# Patient Record
Sex: Female | Born: 1940 | Race: White | Hispanic: No | Smoking: Former smoker
Health system: Southern US, Community
[De-identification: ages and names within clinical notes are randomized; demographics above are authoritative.]

## PROBLEM LIST (undated history)

## (undated) DIAGNOSIS — Z8052 Family history of malignant neoplasm of bladder: Secondary | ICD-10-CM

## (undated) DIAGNOSIS — Z800 Family history of malignant neoplasm of digestive organs: Secondary | ICD-10-CM

## (undated) DIAGNOSIS — I77819 Aortic ectasia, unspecified site: Secondary | ICD-10-CM

## (undated) DIAGNOSIS — I714 Abdominal aortic aneurysm, without rupture, unspecified: Secondary | ICD-10-CM

## (undated) DIAGNOSIS — J449 Chronic obstructive pulmonary disease, unspecified: Secondary | ICD-10-CM

## (undated) DIAGNOSIS — I6529 Occlusion and stenosis of unspecified carotid artery: Secondary | ICD-10-CM

## (undated) DIAGNOSIS — Z8051 Family history of malignant neoplasm of kidney: Secondary | ICD-10-CM

## (undated) DIAGNOSIS — M1990 Unspecified osteoarthritis, unspecified site: Secondary | ICD-10-CM

## (undated) DIAGNOSIS — C801 Malignant (primary) neoplasm, unspecified: Secondary | ICD-10-CM

## (undated) DIAGNOSIS — Z8481 Family history of carrier of genetic disease: Secondary | ICD-10-CM

## (undated) HISTORY — DX: Family history of malignant neoplasm of digestive organs: Z80.0

## (undated) HISTORY — DX: Family history of carrier of genetic disease: Z84.81

## (undated) HISTORY — DX: Family history of malignant neoplasm of kidney: Z80.51

## (undated) HISTORY — DX: Family history of malignant neoplasm of bladder: Z80.52

## (undated) HISTORY — PX: BACK SURGERY: SHX140

---
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
## 1972-05-28 HISTORY — PX: ABDOMINAL HYSTERECTOMY: SHX81

## 2002-05-28 DIAGNOSIS — R911 Solitary pulmonary nodule: Secondary | ICD-10-CM

## 2002-05-28 HISTORY — DX: Solitary pulmonary nodule: R91.1

## 2010-04-06 DIAGNOSIS — I10 Essential (primary) hypertension: Secondary | ICD-10-CM | POA: Insufficient documentation

## 2010-04-06 DIAGNOSIS — E785 Hyperlipidemia, unspecified: Secondary | ICD-10-CM | POA: Insufficient documentation

## 2010-11-27 DIAGNOSIS — R0600 Dyspnea, unspecified: Secondary | ICD-10-CM | POA: Insufficient documentation

## 2011-04-26 DIAGNOSIS — E559 Vitamin D deficiency, unspecified: Secondary | ICD-10-CM | POA: Insufficient documentation

## 2011-08-23 DIAGNOSIS — E039 Hypothyroidism, unspecified: Secondary | ICD-10-CM | POA: Insufficient documentation

## 2012-02-21 DIAGNOSIS — F0630 Mood disorder due to known physiological condition, unspecified: Secondary | ICD-10-CM | POA: Insufficient documentation

## 2012-05-28 HISTORY — PX: CAROTID ENDARTERECTOMY: SUR193

## 2012-05-28 HISTORY — PX: TOTAL KNEE ARTHROPLASTY: SHX125

## 2012-11-12 DIAGNOSIS — R55 Syncope and collapse: Secondary | ICD-10-CM | POA: Insufficient documentation

## 2012-11-12 DIAGNOSIS — G4700 Insomnia, unspecified: Secondary | ICD-10-CM | POA: Insufficient documentation

## 2013-05-28 HISTORY — PX: CAROTID ENDARTERECTOMY: SUR193

## 2014-05-28 HISTORY — PX: CHOLECYSTECTOMY: SHX55

## 2015-05-29 HISTORY — PX: CATARACT EXTRACTION: SUR2

## 2015-06-01 DIAGNOSIS — M5416 Radiculopathy, lumbar region: Secondary | ICD-10-CM | POA: Diagnosis not present

## 2015-06-01 DIAGNOSIS — M9903 Segmental and somatic dysfunction of lumbar region: Secondary | ICD-10-CM | POA: Diagnosis not present

## 2015-06-14 DIAGNOSIS — L03031 Cellulitis of right toe: Secondary | ICD-10-CM | POA: Diagnosis not present

## 2015-06-16 DIAGNOSIS — E039 Hypothyroidism, unspecified: Secondary | ICD-10-CM | POA: Diagnosis not present

## 2015-06-16 DIAGNOSIS — I119 Hypertensive heart disease without heart failure: Secondary | ICD-10-CM | POA: Diagnosis not present

## 2015-06-16 DIAGNOSIS — E782 Mixed hyperlipidemia: Secondary | ICD-10-CM | POA: Diagnosis not present

## 2015-06-20 DIAGNOSIS — Z1389 Encounter for screening for other disorder: Secondary | ICD-10-CM | POA: Diagnosis not present

## 2015-06-20 DIAGNOSIS — E782 Mixed hyperlipidemia: Secondary | ICD-10-CM | POA: Diagnosis not present

## 2015-06-20 DIAGNOSIS — Z6835 Body mass index (BMI) 35.0-35.9, adult: Secondary | ICD-10-CM | POA: Diagnosis not present

## 2015-06-20 DIAGNOSIS — E039 Hypothyroidism, unspecified: Secondary | ICD-10-CM | POA: Diagnosis not present

## 2015-06-29 DIAGNOSIS — M9903 Segmental and somatic dysfunction of lumbar region: Secondary | ICD-10-CM | POA: Diagnosis not present

## 2015-06-29 DIAGNOSIS — M5416 Radiculopathy, lumbar region: Secondary | ICD-10-CM | POA: Diagnosis not present

## 2015-07-27 DIAGNOSIS — M5416 Radiculopathy, lumbar region: Secondary | ICD-10-CM | POA: Diagnosis not present

## 2015-07-27 DIAGNOSIS — M9903 Segmental and somatic dysfunction of lumbar region: Secondary | ICD-10-CM | POA: Diagnosis not present

## 2015-07-28 DIAGNOSIS — M1711 Unilateral primary osteoarthritis, right knee: Secondary | ICD-10-CM | POA: Diagnosis not present

## 2015-08-22 DIAGNOSIS — G4700 Insomnia, unspecified: Secondary | ICD-10-CM | POA: Diagnosis not present

## 2015-08-22 DIAGNOSIS — J329 Chronic sinusitis, unspecified: Secondary | ICD-10-CM | POA: Diagnosis not present

## 2015-08-22 DIAGNOSIS — Z6837 Body mass index (BMI) 37.0-37.9, adult: Secondary | ICD-10-CM | POA: Diagnosis not present

## 2015-08-22 DIAGNOSIS — R05 Cough: Secondary | ICD-10-CM | POA: Diagnosis not present

## 2015-08-24 DIAGNOSIS — M5416 Radiculopathy, lumbar region: Secondary | ICD-10-CM | POA: Diagnosis not present

## 2015-08-24 DIAGNOSIS — M9903 Segmental and somatic dysfunction of lumbar region: Secondary | ICD-10-CM | POA: Diagnosis not present

## 2015-08-31 DIAGNOSIS — M9903 Segmental and somatic dysfunction of lumbar region: Secondary | ICD-10-CM | POA: Diagnosis not present

## 2015-08-31 DIAGNOSIS — M5416 Radiculopathy, lumbar region: Secondary | ICD-10-CM | POA: Diagnosis not present

## 2015-09-23 DIAGNOSIS — Z6837 Body mass index (BMI) 37.0-37.9, adult: Secondary | ICD-10-CM | POA: Diagnosis not present

## 2015-09-23 DIAGNOSIS — J40 Bronchitis, not specified as acute or chronic: Secondary | ICD-10-CM | POA: Diagnosis not present

## 2015-09-28 DIAGNOSIS — H40003 Preglaucoma, unspecified, bilateral: Secondary | ICD-10-CM | POA: Diagnosis not present

## 2015-09-28 DIAGNOSIS — H26493 Other secondary cataract, bilateral: Secondary | ICD-10-CM | POA: Diagnosis not present

## 2015-10-05 DIAGNOSIS — M9903 Segmental and somatic dysfunction of lumbar region: Secondary | ICD-10-CM | POA: Diagnosis not present

## 2015-10-05 DIAGNOSIS — M5416 Radiculopathy, lumbar region: Secondary | ICD-10-CM | POA: Diagnosis not present

## 2015-10-11 DIAGNOSIS — I119 Hypertensive heart disease without heart failure: Secondary | ICD-10-CM | POA: Diagnosis not present

## 2015-10-11 DIAGNOSIS — E782 Mixed hyperlipidemia: Secondary | ICD-10-CM | POA: Diagnosis not present

## 2015-10-18 DIAGNOSIS — Z6839 Body mass index (BMI) 39.0-39.9, adult: Secondary | ICD-10-CM | POA: Diagnosis not present

## 2015-10-18 DIAGNOSIS — E039 Hypothyroidism, unspecified: Secondary | ICD-10-CM | POA: Diagnosis not present

## 2015-10-18 DIAGNOSIS — Z7189 Other specified counseling: Secondary | ICD-10-CM | POA: Diagnosis not present

## 2015-10-18 DIAGNOSIS — E782 Mixed hyperlipidemia: Secondary | ICD-10-CM | POA: Diagnosis not present

## 2015-10-18 DIAGNOSIS — Z0000 Encounter for general adult medical examination without abnormal findings: Secondary | ICD-10-CM | POA: Diagnosis not present

## 2015-10-18 DIAGNOSIS — Z139 Encounter for screening, unspecified: Secondary | ICD-10-CM | POA: Diagnosis not present

## 2015-10-18 DIAGNOSIS — I119 Hypertensive heart disease without heart failure: Secondary | ICD-10-CM | POA: Diagnosis not present

## 2015-10-18 DIAGNOSIS — Z1389 Encounter for screening for other disorder: Secondary | ICD-10-CM | POA: Diagnosis not present

## 2015-10-25 DIAGNOSIS — Z1231 Encounter for screening mammogram for malignant neoplasm of breast: Secondary | ICD-10-CM | POA: Diagnosis not present

## 2015-11-02 DIAGNOSIS — M5416 Radiculopathy, lumbar region: Secondary | ICD-10-CM | POA: Diagnosis not present

## 2015-11-02 DIAGNOSIS — M9903 Segmental and somatic dysfunction of lumbar region: Secondary | ICD-10-CM | POA: Diagnosis not present

## 2015-11-08 DIAGNOSIS — M1711 Unilateral primary osteoarthritis, right knee: Secondary | ICD-10-CM | POA: Diagnosis not present

## 2015-11-11 DIAGNOSIS — M461 Sacroiliitis, not elsewhere classified: Secondary | ICD-10-CM | POA: Diagnosis not present

## 2015-11-23 DIAGNOSIS — M5416 Radiculopathy, lumbar region: Secondary | ICD-10-CM | POA: Diagnosis not present

## 2015-11-23 DIAGNOSIS — M9903 Segmental and somatic dysfunction of lumbar region: Secondary | ICD-10-CM | POA: Diagnosis not present

## 2015-12-01 DIAGNOSIS — M9903 Segmental and somatic dysfunction of lumbar region: Secondary | ICD-10-CM | POA: Diagnosis not present

## 2015-12-01 DIAGNOSIS — M5416 Radiculopathy, lumbar region: Secondary | ICD-10-CM | POA: Diagnosis not present

## 2015-12-21 DIAGNOSIS — I119 Hypertensive heart disease without heart failure: Secondary | ICD-10-CM | POA: Diagnosis not present

## 2015-12-21 DIAGNOSIS — E782 Mixed hyperlipidemia: Secondary | ICD-10-CM | POA: Diagnosis not present

## 2015-12-28 DIAGNOSIS — I6529 Occlusion and stenosis of unspecified carotid artery: Secondary | ICD-10-CM | POA: Diagnosis not present

## 2015-12-28 DIAGNOSIS — E039 Hypothyroidism, unspecified: Secondary | ICD-10-CM | POA: Diagnosis not present

## 2015-12-28 DIAGNOSIS — E782 Mixed hyperlipidemia: Secondary | ICD-10-CM | POA: Diagnosis not present

## 2015-12-28 DIAGNOSIS — I119 Hypertensive heart disease without heart failure: Secondary | ICD-10-CM | POA: Diagnosis not present

## 2015-12-29 DIAGNOSIS — M5416 Radiculopathy, lumbar region: Secondary | ICD-10-CM | POA: Diagnosis not present

## 2015-12-29 DIAGNOSIS — M9903 Segmental and somatic dysfunction of lumbar region: Secondary | ICD-10-CM | POA: Diagnosis not present

## 2016-01-11 DIAGNOSIS — Z6838 Body mass index (BMI) 38.0-38.9, adult: Secondary | ICD-10-CM | POA: Diagnosis not present

## 2016-01-11 DIAGNOSIS — Z01818 Encounter for other preprocedural examination: Secondary | ICD-10-CM | POA: Diagnosis not present

## 2016-01-11 DIAGNOSIS — I739 Peripheral vascular disease, unspecified: Secondary | ICD-10-CM | POA: Diagnosis not present

## 2016-01-16 DIAGNOSIS — R52 Pain, unspecified: Secondary | ICD-10-CM | POA: Diagnosis not present

## 2016-01-16 DIAGNOSIS — J984 Other disorders of lung: Secondary | ICD-10-CM | POA: Diagnosis not present

## 2016-01-16 DIAGNOSIS — M79609 Pain in unspecified limb: Secondary | ICD-10-CM | POA: Diagnosis not present

## 2016-01-16 DIAGNOSIS — Z01818 Encounter for other preprocedural examination: Secondary | ICD-10-CM | POA: Diagnosis not present

## 2016-01-16 DIAGNOSIS — Z79899 Other long term (current) drug therapy: Secondary | ICD-10-CM | POA: Diagnosis not present

## 2016-01-23 DIAGNOSIS — M1711 Unilateral primary osteoarthritis, right knee: Secondary | ICD-10-CM | POA: Diagnosis not present

## 2016-02-01 DIAGNOSIS — R079 Chest pain, unspecified: Secondary | ICD-10-CM | POA: Diagnosis not present

## 2016-02-01 DIAGNOSIS — I2510 Atherosclerotic heart disease of native coronary artery without angina pectoris: Secondary | ICD-10-CM | POA: Diagnosis not present

## 2016-02-01 DIAGNOSIS — I10 Essential (primary) hypertension: Secondary | ICD-10-CM | POA: Diagnosis not present

## 2016-02-01 DIAGNOSIS — Z7902 Long term (current) use of antithrombotics/antiplatelets: Secondary | ICD-10-CM | POA: Diagnosis not present

## 2016-02-01 DIAGNOSIS — Z471 Aftercare following joint replacement surgery: Secondary | ICD-10-CM | POA: Diagnosis not present

## 2016-02-01 DIAGNOSIS — Z880 Allergy status to penicillin: Secondary | ICD-10-CM | POA: Diagnosis not present

## 2016-02-01 DIAGNOSIS — E785 Hyperlipidemia, unspecified: Secondary | ICD-10-CM | POA: Diagnosis not present

## 2016-02-01 DIAGNOSIS — I739 Peripheral vascular disease, unspecified: Secondary | ICD-10-CM | POA: Diagnosis not present

## 2016-02-01 DIAGNOSIS — M1711 Unilateral primary osteoarthritis, right knee: Secondary | ICD-10-CM | POA: Diagnosis not present

## 2016-02-01 DIAGNOSIS — G8918 Other acute postprocedural pain: Secondary | ICD-10-CM | POA: Diagnosis not present

## 2016-02-01 DIAGNOSIS — E039 Hypothyroidism, unspecified: Secondary | ICD-10-CM | POA: Diagnosis not present

## 2016-02-01 DIAGNOSIS — Z885 Allergy status to narcotic agent status: Secondary | ICD-10-CM | POA: Diagnosis not present

## 2016-02-01 DIAGNOSIS — Z96651 Presence of right artificial knee joint: Secondary | ICD-10-CM | POA: Diagnosis not present

## 2016-02-01 DIAGNOSIS — Z79899 Other long term (current) drug therapy: Secondary | ICD-10-CM | POA: Diagnosis not present

## 2016-02-03 DIAGNOSIS — Z471 Aftercare following joint replacement surgery: Secondary | ICD-10-CM | POA: Diagnosis not present

## 2016-02-03 DIAGNOSIS — I739 Peripheral vascular disease, unspecified: Secondary | ICD-10-CM | POA: Diagnosis not present

## 2016-02-03 DIAGNOSIS — I119 Hypertensive heart disease without heart failure: Secondary | ICD-10-CM | POA: Diagnosis not present

## 2016-02-03 DIAGNOSIS — M1711 Unilateral primary osteoarthritis, right knee: Secondary | ICD-10-CM | POA: Diagnosis not present

## 2016-02-03 DIAGNOSIS — Z96651 Presence of right artificial knee joint: Secondary | ICD-10-CM | POA: Diagnosis not present

## 2016-02-03 DIAGNOSIS — G8918 Other acute postprocedural pain: Secondary | ICD-10-CM | POA: Diagnosis not present

## 2016-02-09 DIAGNOSIS — I739 Peripheral vascular disease, unspecified: Secondary | ICD-10-CM | POA: Diagnosis not present

## 2016-02-09 DIAGNOSIS — I119 Hypertensive heart disease without heart failure: Secondary | ICD-10-CM | POA: Diagnosis not present

## 2016-02-09 DIAGNOSIS — G8918 Other acute postprocedural pain: Secondary | ICD-10-CM | POA: Diagnosis not present

## 2016-02-09 DIAGNOSIS — Z471 Aftercare following joint replacement surgery: Secondary | ICD-10-CM | POA: Diagnosis not present

## 2016-02-17 DIAGNOSIS — M189 Osteoarthritis of first carpometacarpal joint, unspecified: Secondary | ICD-10-CM | POA: Diagnosis not present

## 2016-02-17 DIAGNOSIS — M5136 Other intervertebral disc degeneration, lumbar region: Secondary | ICD-10-CM | POA: Diagnosis not present

## 2016-02-17 DIAGNOSIS — Z79891 Long term (current) use of opiate analgesic: Secondary | ICD-10-CM | POA: Diagnosis not present

## 2016-02-17 DIAGNOSIS — I10 Essential (primary) hypertension: Secondary | ICD-10-CM | POA: Diagnosis not present

## 2016-02-17 DIAGNOSIS — Z7982 Long term (current) use of aspirin: Secondary | ICD-10-CM | POA: Diagnosis not present

## 2016-02-17 DIAGNOSIS — Z7902 Long term (current) use of antithrombotics/antiplatelets: Secondary | ICD-10-CM | POA: Diagnosis not present

## 2016-02-17 DIAGNOSIS — I739 Peripheral vascular disease, unspecified: Secondary | ICD-10-CM | POA: Diagnosis not present

## 2016-02-17 DIAGNOSIS — M4807 Spinal stenosis, lumbosacral region: Secondary | ICD-10-CM | POA: Diagnosis not present

## 2016-02-17 DIAGNOSIS — F341 Dysthymic disorder: Secondary | ICD-10-CM | POA: Diagnosis not present

## 2016-02-17 DIAGNOSIS — J45909 Unspecified asthma, uncomplicated: Secondary | ICD-10-CM | POA: Diagnosis not present

## 2016-02-17 DIAGNOSIS — Z471 Aftercare following joint replacement surgery: Secondary | ICD-10-CM | POA: Diagnosis not present

## 2016-02-23 DIAGNOSIS — Z7189 Other specified counseling: Secondary | ICD-10-CM | POA: Diagnosis not present

## 2016-02-23 DIAGNOSIS — E782 Mixed hyperlipidemia: Secondary | ICD-10-CM | POA: Diagnosis not present

## 2016-02-23 DIAGNOSIS — I739 Peripheral vascular disease, unspecified: Secondary | ICD-10-CM | POA: Diagnosis not present

## 2016-02-23 DIAGNOSIS — Z96651 Presence of right artificial knee joint: Secondary | ICD-10-CM | POA: Diagnosis not present

## 2016-03-13 DIAGNOSIS — Z96651 Presence of right artificial knee joint: Secondary | ICD-10-CM | POA: Diagnosis not present

## 2016-03-15 DIAGNOSIS — R600 Localized edema: Secondary | ICD-10-CM | POA: Diagnosis not present

## 2016-03-15 DIAGNOSIS — M7989 Other specified soft tissue disorders: Secondary | ICD-10-CM | POA: Diagnosis not present

## 2016-03-20 DIAGNOSIS — R609 Edema, unspecified: Secondary | ICD-10-CM | POA: Diagnosis not present

## 2016-03-20 DIAGNOSIS — Z6839 Body mass index (BMI) 39.0-39.9, adult: Secondary | ICD-10-CM | POA: Diagnosis not present

## 2016-03-23 DIAGNOSIS — Z23 Encounter for immunization: Secondary | ICD-10-CM | POA: Diagnosis not present

## 2016-03-23 DIAGNOSIS — Z6838 Body mass index (BMI) 38.0-38.9, adult: Secondary | ICD-10-CM | POA: Diagnosis not present

## 2016-03-23 DIAGNOSIS — R609 Edema, unspecified: Secondary | ICD-10-CM | POA: Diagnosis not present

## 2016-03-29 DIAGNOSIS — I119 Hypertensive heart disease without heart failure: Secondary | ICD-10-CM | POA: Diagnosis not present

## 2016-03-29 DIAGNOSIS — E039 Hypothyroidism, unspecified: Secondary | ICD-10-CM | POA: Diagnosis not present

## 2016-03-29 DIAGNOSIS — E782 Mixed hyperlipidemia: Secondary | ICD-10-CM | POA: Diagnosis not present

## 2016-04-05 DIAGNOSIS — E785 Hyperlipidemia, unspecified: Secondary | ICD-10-CM | POA: Diagnosis not present

## 2016-04-05 DIAGNOSIS — R600 Localized edema: Secondary | ICD-10-CM | POA: Diagnosis not present

## 2016-04-05 DIAGNOSIS — I119 Hypertensive heart disease without heart failure: Secondary | ICD-10-CM | POA: Diagnosis not present

## 2016-04-05 DIAGNOSIS — E039 Hypothyroidism, unspecified: Secondary | ICD-10-CM | POA: Diagnosis not present

## 2016-04-11 DIAGNOSIS — H26493 Other secondary cataract, bilateral: Secondary | ICD-10-CM | POA: Diagnosis not present

## 2016-04-11 DIAGNOSIS — H40003 Preglaucoma, unspecified, bilateral: Secondary | ICD-10-CM | POA: Diagnosis not present

## 2016-04-24 DIAGNOSIS — Z96651 Presence of right artificial knee joint: Secondary | ICD-10-CM | POA: Diagnosis not present

## 2016-05-11 DIAGNOSIS — R197 Diarrhea, unspecified: Secondary | ICD-10-CM | POA: Diagnosis not present

## 2016-05-11 DIAGNOSIS — Z800 Family history of malignant neoplasm of digestive organs: Secondary | ICD-10-CM | POA: Diagnosis not present

## 2016-05-11 DIAGNOSIS — Z86010 Personal history of colonic polyps: Secondary | ICD-10-CM | POA: Diagnosis not present

## 2016-05-11 DIAGNOSIS — Z1211 Encounter for screening for malignant neoplasm of colon: Secondary | ICD-10-CM | POA: Diagnosis not present

## 2016-05-17 DIAGNOSIS — R21 Rash and other nonspecific skin eruption: Secondary | ICD-10-CM | POA: Diagnosis not present

## 2016-05-17 DIAGNOSIS — Z6838 Body mass index (BMI) 38.0-38.9, adult: Secondary | ICD-10-CM | POA: Diagnosis not present

## 2016-06-04 DIAGNOSIS — M1712 Unilateral primary osteoarthritis, left knee: Secondary | ICD-10-CM | POA: Diagnosis not present

## 2016-06-19 DIAGNOSIS — E039 Hypothyroidism, unspecified: Secondary | ICD-10-CM | POA: Diagnosis not present

## 2016-06-19 DIAGNOSIS — Z7689 Persons encountering health services in other specified circumstances: Secondary | ICD-10-CM | POA: Diagnosis not present

## 2016-06-19 DIAGNOSIS — E669 Obesity, unspecified: Secondary | ICD-10-CM | POA: Diagnosis not present

## 2016-06-19 DIAGNOSIS — Z6837 Body mass index (BMI) 37.0-37.9, adult: Secondary | ICD-10-CM | POA: Diagnosis not present

## 2016-06-25 DIAGNOSIS — K5730 Diverticulosis of large intestine without perforation or abscess without bleeding: Secondary | ICD-10-CM | POA: Diagnosis not present

## 2016-06-25 DIAGNOSIS — I739 Peripheral vascular disease, unspecified: Secondary | ICD-10-CM | POA: Diagnosis not present

## 2016-06-25 DIAGNOSIS — E785 Hyperlipidemia, unspecified: Secondary | ICD-10-CM | POA: Diagnosis not present

## 2016-06-25 DIAGNOSIS — Z79899 Other long term (current) drug therapy: Secondary | ICD-10-CM | POA: Diagnosis not present

## 2016-06-25 DIAGNOSIS — Z86010 Personal history of colonic polyps: Secondary | ICD-10-CM | POA: Diagnosis not present

## 2016-06-25 DIAGNOSIS — K648 Other hemorrhoids: Secondary | ICD-10-CM | POA: Diagnosis not present

## 2016-06-25 DIAGNOSIS — Z9049 Acquired absence of other specified parts of digestive tract: Secondary | ICD-10-CM | POA: Diagnosis not present

## 2016-06-25 DIAGNOSIS — E039 Hypothyroidism, unspecified: Secondary | ICD-10-CM | POA: Diagnosis not present

## 2016-06-25 DIAGNOSIS — Z800 Family history of malignant neoplasm of digestive organs: Secondary | ICD-10-CM | POA: Diagnosis not present

## 2016-06-25 DIAGNOSIS — I10 Essential (primary) hypertension: Secondary | ICD-10-CM | POA: Diagnosis not present

## 2016-06-25 DIAGNOSIS — F419 Anxiety disorder, unspecified: Secondary | ICD-10-CM | POA: Diagnosis not present

## 2016-06-25 DIAGNOSIS — Z1211 Encounter for screening for malignant neoplasm of colon: Secondary | ICD-10-CM | POA: Diagnosis not present

## 2016-06-25 DIAGNOSIS — R197 Diarrhea, unspecified: Secondary | ICD-10-CM | POA: Diagnosis not present

## 2016-06-25 DIAGNOSIS — F329 Major depressive disorder, single episode, unspecified: Secondary | ICD-10-CM | POA: Diagnosis not present

## 2016-06-29 DIAGNOSIS — R1013 Epigastric pain: Secondary | ICD-10-CM | POA: Diagnosis not present

## 2016-06-29 DIAGNOSIS — Z6836 Body mass index (BMI) 36.0-36.9, adult: Secondary | ICD-10-CM | POA: Diagnosis not present

## 2016-06-29 DIAGNOSIS — Z8719 Personal history of other diseases of the digestive system: Secondary | ICD-10-CM | POA: Diagnosis not present

## 2016-06-29 DIAGNOSIS — I77819 Aortic ectasia, unspecified site: Secondary | ICD-10-CM | POA: Diagnosis not present

## 2016-07-04 DIAGNOSIS — R1013 Epigastric pain: Secondary | ICD-10-CM | POA: Diagnosis not present

## 2016-07-04 DIAGNOSIS — I77819 Aortic ectasia, unspecified site: Secondary | ICD-10-CM | POA: Diagnosis not present

## 2016-07-26 DIAGNOSIS — M1711 Unilateral primary osteoarthritis, right knee: Secondary | ICD-10-CM | POA: Diagnosis not present

## 2016-07-26 DIAGNOSIS — Z96651 Presence of right artificial knee joint: Secondary | ICD-10-CM | POA: Diagnosis not present

## 2016-09-03 DIAGNOSIS — L578 Other skin changes due to chronic exposure to nonionizing radiation: Secondary | ICD-10-CM | POA: Diagnosis not present

## 2016-09-03 DIAGNOSIS — L821 Other seborrheic keratosis: Secondary | ICD-10-CM | POA: Diagnosis not present

## 2016-09-03 DIAGNOSIS — L570 Actinic keratosis: Secondary | ICD-10-CM | POA: Diagnosis not present

## 2016-09-27 DIAGNOSIS — M2550 Pain in unspecified joint: Secondary | ICD-10-CM | POA: Diagnosis not present

## 2016-09-27 DIAGNOSIS — I77819 Aortic ectasia, unspecified site: Secondary | ICD-10-CM | POA: Diagnosis not present

## 2016-09-27 DIAGNOSIS — E039 Hypothyroidism, unspecified: Secondary | ICD-10-CM | POA: Diagnosis not present

## 2016-09-27 DIAGNOSIS — Z6836 Body mass index (BMI) 36.0-36.9, adult: Secondary | ICD-10-CM | POA: Diagnosis not present

## 2016-09-27 DIAGNOSIS — E669 Obesity, unspecified: Secondary | ICD-10-CM | POA: Diagnosis not present

## 2016-09-27 DIAGNOSIS — E785 Hyperlipidemia, unspecified: Secondary | ICD-10-CM | POA: Diagnosis not present

## 2016-11-27 DIAGNOSIS — M2550 Pain in unspecified joint: Secondary | ICD-10-CM | POA: Diagnosis not present

## 2016-11-27 DIAGNOSIS — L309 Dermatitis, unspecified: Secondary | ICD-10-CM | POA: Diagnosis not present

## 2016-11-27 DIAGNOSIS — Z139 Encounter for screening, unspecified: Secondary | ICD-10-CM | POA: Diagnosis not present

## 2016-11-27 DIAGNOSIS — Z6837 Body mass index (BMI) 37.0-37.9, adult: Secondary | ICD-10-CM | POA: Diagnosis not present

## 2016-11-27 DIAGNOSIS — Z1389 Encounter for screening for other disorder: Secondary | ICD-10-CM | POA: Diagnosis not present

## 2016-11-27 DIAGNOSIS — Z9181 History of falling: Secondary | ICD-10-CM | POA: Diagnosis not present

## 2017-01-10 DIAGNOSIS — N959 Unspecified menopausal and perimenopausal disorder: Secondary | ICD-10-CM | POA: Diagnosis not present

## 2017-01-10 DIAGNOSIS — Z1231 Encounter for screening mammogram for malignant neoplasm of breast: Secondary | ICD-10-CM | POA: Diagnosis not present

## 2017-01-10 DIAGNOSIS — Z136 Encounter for screening for cardiovascular disorders: Secondary | ICD-10-CM | POA: Diagnosis not present

## 2017-01-10 DIAGNOSIS — Z1389 Encounter for screening for other disorder: Secondary | ICD-10-CM | POA: Diagnosis not present

## 2017-01-10 DIAGNOSIS — Z0000 Encounter for general adult medical examination without abnormal findings: Secondary | ICD-10-CM | POA: Diagnosis not present

## 2017-01-10 DIAGNOSIS — Z87891 Personal history of nicotine dependence: Secondary | ICD-10-CM | POA: Diagnosis not present

## 2017-01-10 DIAGNOSIS — Z9181 History of falling: Secondary | ICD-10-CM | POA: Diagnosis not present

## 2017-01-10 DIAGNOSIS — E785 Hyperlipidemia, unspecified: Secondary | ICD-10-CM | POA: Diagnosis not present

## 2017-01-10 DIAGNOSIS — Z6837 Body mass index (BMI) 37.0-37.9, adult: Secondary | ICD-10-CM | POA: Diagnosis not present

## 2017-01-23 DIAGNOSIS — N959 Unspecified menopausal and perimenopausal disorder: Secondary | ICD-10-CM | POA: Diagnosis not present

## 2017-01-23 DIAGNOSIS — M8588 Other specified disorders of bone density and structure, other site: Secondary | ICD-10-CM | POA: Diagnosis not present

## 2017-01-23 DIAGNOSIS — Z1231 Encounter for screening mammogram for malignant neoplasm of breast: Secondary | ICD-10-CM | POA: Diagnosis not present

## 2017-01-29 DIAGNOSIS — Z96651 Presence of right artificial knee joint: Secondary | ICD-10-CM | POA: Diagnosis not present

## 2017-01-31 DIAGNOSIS — M1612 Unilateral primary osteoarthritis, left hip: Secondary | ICD-10-CM | POA: Diagnosis not present

## 2017-02-18 DIAGNOSIS — M1612 Unilateral primary osteoarthritis, left hip: Secondary | ICD-10-CM | POA: Diagnosis not present

## 2017-02-18 DIAGNOSIS — M25552 Pain in left hip: Secondary | ICD-10-CM | POA: Diagnosis not present

## 2017-02-22 DIAGNOSIS — M1612 Unilateral primary osteoarthritis, left hip: Secondary | ICD-10-CM | POA: Diagnosis not present

## 2017-04-04 DIAGNOSIS — E039 Hypothyroidism, unspecified: Secondary | ICD-10-CM | POA: Diagnosis not present

## 2017-04-04 DIAGNOSIS — E669 Obesity, unspecified: Secondary | ICD-10-CM | POA: Diagnosis not present

## 2017-04-04 DIAGNOSIS — E785 Hyperlipidemia, unspecified: Secondary | ICD-10-CM | POA: Diagnosis not present

## 2017-04-04 DIAGNOSIS — Z23 Encounter for immunization: Secondary | ICD-10-CM | POA: Diagnosis not present

## 2017-04-04 DIAGNOSIS — G3184 Mild cognitive impairment, so stated: Secondary | ICD-10-CM | POA: Diagnosis not present

## 2017-04-04 DIAGNOSIS — M2550 Pain in unspecified joint: Secondary | ICD-10-CM | POA: Diagnosis not present

## 2017-04-04 DIAGNOSIS — Z87891 Personal history of nicotine dependence: Secondary | ICD-10-CM | POA: Diagnosis not present

## 2017-05-16 DIAGNOSIS — R197 Diarrhea, unspecified: Secondary | ICD-10-CM | POA: Diagnosis not present

## 2017-05-16 DIAGNOSIS — Z23 Encounter for immunization: Secondary | ICD-10-CM | POA: Diagnosis not present

## 2017-05-16 DIAGNOSIS — E876 Hypokalemia: Secondary | ICD-10-CM | POA: Diagnosis not present

## 2017-05-16 DIAGNOSIS — A045 Campylobacter enteritis: Secondary | ICD-10-CM | POA: Diagnosis not present

## 2017-05-16 DIAGNOSIS — I77811 Abdominal aortic ectasia: Secondary | ICD-10-CM | POA: Diagnosis not present

## 2017-05-16 DIAGNOSIS — A419 Sepsis, unspecified organism: Secondary | ICD-10-CM | POA: Diagnosis not present

## 2017-05-16 DIAGNOSIS — Z87891 Personal history of nicotine dependence: Secondary | ICD-10-CM | POA: Diagnosis not present

## 2017-05-16 DIAGNOSIS — N390 Urinary tract infection, site not specified: Secondary | ICD-10-CM | POA: Diagnosis not present

## 2017-05-16 DIAGNOSIS — N201 Calculus of ureter: Secondary | ICD-10-CM | POA: Diagnosis not present

## 2017-05-16 DIAGNOSIS — Z6835 Body mass index (BMI) 35.0-35.9, adult: Secondary | ICD-10-CM | POA: Diagnosis not present

## 2017-05-16 DIAGNOSIS — I951 Orthostatic hypotension: Secondary | ICD-10-CM | POA: Diagnosis not present

## 2017-05-16 DIAGNOSIS — Z7982 Long term (current) use of aspirin: Secondary | ICD-10-CM | POA: Diagnosis not present

## 2017-05-16 DIAGNOSIS — E860 Dehydration: Secondary | ICD-10-CM | POA: Diagnosis not present

## 2017-05-16 DIAGNOSIS — N1330 Unspecified hydronephrosis: Secondary | ICD-10-CM | POA: Diagnosis not present

## 2017-05-16 DIAGNOSIS — Z79899 Other long term (current) drug therapy: Secondary | ICD-10-CM | POA: Diagnosis not present

## 2017-05-16 DIAGNOSIS — R109 Unspecified abdominal pain: Secondary | ICD-10-CM | POA: Diagnosis not present

## 2017-05-17 DIAGNOSIS — I951 Orthostatic hypotension: Secondary | ICD-10-CM | POA: Diagnosis not present

## 2017-05-17 DIAGNOSIS — A045 Campylobacter enteritis: Secondary | ICD-10-CM | POA: Diagnosis not present

## 2017-05-17 DIAGNOSIS — N1330 Unspecified hydronephrosis: Secondary | ICD-10-CM | POA: Diagnosis not present

## 2017-05-17 DIAGNOSIS — E860 Dehydration: Secondary | ICD-10-CM | POA: Diagnosis not present

## 2017-05-17 DIAGNOSIS — I77811 Abdominal aortic ectasia: Secondary | ICD-10-CM | POA: Diagnosis not present

## 2017-05-17 DIAGNOSIS — N201 Calculus of ureter: Secondary | ICD-10-CM | POA: Diagnosis not present

## 2017-05-23 DIAGNOSIS — N1330 Unspecified hydronephrosis: Secondary | ICD-10-CM | POA: Diagnosis not present

## 2017-05-23 DIAGNOSIS — N201 Calculus of ureter: Secondary | ICD-10-CM | POA: Diagnosis not present

## 2017-05-23 DIAGNOSIS — I77819 Aortic ectasia, unspecified site: Secondary | ICD-10-CM | POA: Diagnosis not present

## 2017-05-23 DIAGNOSIS — A419 Sepsis, unspecified organism: Secondary | ICD-10-CM | POA: Diagnosis not present

## 2017-05-23 DIAGNOSIS — Z79899 Other long term (current) drug therapy: Secondary | ICD-10-CM | POA: Diagnosis not present

## 2017-05-23 DIAGNOSIS — E860 Dehydration: Secondary | ICD-10-CM | POA: Diagnosis not present

## 2017-05-23 DIAGNOSIS — Z09 Encounter for follow-up examination after completed treatment for conditions other than malignant neoplasm: Secondary | ICD-10-CM | POA: Diagnosis not present

## 2017-05-23 DIAGNOSIS — A045 Campylobacter enteritis: Secondary | ICD-10-CM | POA: Diagnosis not present

## 2017-05-23 DIAGNOSIS — Z6838 Body mass index (BMI) 38.0-38.9, adult: Secondary | ICD-10-CM | POA: Diagnosis not present

## 2017-05-27 ENCOUNTER — Other Ambulatory Visit: Payer: Self-pay | Admitting: *Deleted

## 2017-05-27 NOTE — Patient Outreach (Signed)
[place] [facility]) Care Management  05/27/2017  [name] 06/20/1940 [id number]  Received referral from [place]; patient discharge from [facility] 05/18/2017;  Per [facility] patient was hospitalized from 12/20-12/22/2018 PCP=_Dr. Marco Collie  Telephone call attempt to patient; message recording does not identify patient. No message left. Telephone call to alternate number-person answered call & disconnected call when request to speak to patient was made.   Plan: Will follow up.  Sherrin Daisy, RN BSN [place] Management Coordinator [facility] Care Management  [phone]

## 2017-05-30 ENCOUNTER — Other Ambulatory Visit: Payer: Self-pay | Admitting: *Deleted

## 2017-05-30 DIAGNOSIS — N200 Calculus of kidney: Secondary | ICD-10-CM | POA: Diagnosis not present

## 2017-05-30 DIAGNOSIS — M549 Dorsalgia, unspecified: Secondary | ICD-10-CM | POA: Diagnosis not present

## 2017-05-30 NOTE — Patient Outreach (Signed)
[place] [facility]) Care Management  05/30/2017  Kaitlyn Martin [birth year] [id number]   Received referral from [place]; patient discharge from [facility] 05/18/2017;  Call #2 to alternate number-Patient identified self with name. States she is not available to talk now & requested call back tomorrow.   Plan: Follow up call. Patient agrees with set appointment.  Sherrin Daisy, RN BSN [place] Management Coordinator [facility] Care Management  [phone]

## 2017-05-31 ENCOUNTER — Ambulatory Visit: Payer: Self-pay | Admitting: *Deleted

## 2017-06-20 DIAGNOSIS — M1712 Unilateral primary osteoarthritis, left knee: Secondary | ICD-10-CM | POA: Diagnosis not present

## 2017-07-01 ENCOUNTER — Encounter: Payer: Self-pay | Admitting: *Deleted

## 2017-07-01 ENCOUNTER — Other Ambulatory Visit: Payer: Self-pay | Admitting: *Deleted

## 2017-07-01 NOTE — Patient Outreach (Signed)
[place] [facility]) Care Management  07/01/2017  STACI DACK 06-Apr-1941 [id number]  Unsuccessful telephone call attempts x 3.  Plan: Geophysicist/field seismologist. Will follow up.  Sherrin Daisy, RN BSN [place] Management Coordinator [facility] Care Management  [phone]

## 2017-07-16 ENCOUNTER — Other Ambulatory Visit: Payer: Self-pay | Admitting: *Deleted

## 2017-07-16 ENCOUNTER — Encounter: Payer: Self-pay | Admitting: *Deleted

## 2017-07-16 NOTE — Patient Outreach (Signed)
[place] [facility]) Care Management  07/16/2017  MUNIRA POLSON [birth year] [id number]  Unsuccessful call attempts; no response to outreach letter.   Plan: Send MD closure letter. Send to care management assistant for case closure.  Sherrin Daisy, RN BSN [place] Management Coordinator [facility] Care Management  [phone]

## 2017-10-03 DIAGNOSIS — M2550 Pain in unspecified joint: Secondary | ICD-10-CM | POA: Diagnosis not present

## 2017-10-03 DIAGNOSIS — E039 Hypothyroidism, unspecified: Secondary | ICD-10-CM | POA: Diagnosis not present

## 2017-10-03 DIAGNOSIS — Z6835 Body mass index (BMI) 35.0-35.9, adult: Secondary | ICD-10-CM | POA: Diagnosis not present

## 2017-10-03 DIAGNOSIS — E785 Hyperlipidemia, unspecified: Secondary | ICD-10-CM | POA: Diagnosis not present

## 2017-10-03 DIAGNOSIS — G4762 Sleep related leg cramps: Secondary | ICD-10-CM | POA: Diagnosis not present

## 2017-10-03 DIAGNOSIS — I77819 Aortic ectasia, unspecified site: Secondary | ICD-10-CM | POA: Diagnosis not present

## 2017-10-14 DIAGNOSIS — H40013 Open angle with borderline findings, low risk, bilateral: Secondary | ICD-10-CM | POA: Diagnosis not present

## 2017-10-14 DIAGNOSIS — H04123 Dry eye syndrome of bilateral lacrimal glands: Secondary | ICD-10-CM | POA: Diagnosis not present

## 2017-10-14 DIAGNOSIS — H26493 Other secondary cataract, bilateral: Secondary | ICD-10-CM | POA: Diagnosis not present

## 2017-10-14 DIAGNOSIS — H35371 Puckering of macula, right eye: Secondary | ICD-10-CM | POA: Diagnosis not present

## 2017-10-17 DIAGNOSIS — F17210 Nicotine dependence, cigarettes, uncomplicated: Secondary | ICD-10-CM | POA: Diagnosis not present

## 2017-10-17 DIAGNOSIS — G4762 Sleep related leg cramps: Secondary | ICD-10-CM | POA: Diagnosis not present

## 2017-10-22 DIAGNOSIS — M545 Low back pain: Secondary | ICD-10-CM | POA: Diagnosis not present

## 2017-10-22 DIAGNOSIS — M1612 Unilateral primary osteoarthritis, left hip: Secondary | ICD-10-CM | POA: Diagnosis not present

## 2017-10-28 DIAGNOSIS — M25552 Pain in left hip: Secondary | ICD-10-CM | POA: Diagnosis not present

## 2017-10-28 DIAGNOSIS — M1612 Unilateral primary osteoarthritis, left hip: Secondary | ICD-10-CM | POA: Diagnosis not present

## 2017-10-29 DIAGNOSIS — M5117 Intervertebral disc disorders with radiculopathy, lumbosacral region: Secondary | ICD-10-CM | POA: Diagnosis not present

## 2017-10-29 DIAGNOSIS — I714 Abdominal aortic aneurysm, without rupture: Secondary | ICD-10-CM | POA: Diagnosis not present

## 2017-10-29 DIAGNOSIS — M4807 Spinal stenosis, lumbosacral region: Secondary | ICD-10-CM | POA: Diagnosis not present

## 2017-10-29 DIAGNOSIS — Z981 Arthrodesis status: Secondary | ICD-10-CM | POA: Diagnosis not present

## 2017-10-29 DIAGNOSIS — M545 Low back pain: Secondary | ICD-10-CM | POA: Diagnosis not present

## 2017-10-31 DIAGNOSIS — M545 Low back pain: Secondary | ICD-10-CM | POA: Diagnosis not present

## 2017-11-06 DIAGNOSIS — G4762 Sleep related leg cramps: Secondary | ICD-10-CM | POA: Diagnosis not present

## 2017-11-06 DIAGNOSIS — Z6837 Body mass index (BMI) 37.0-37.9, adult: Secondary | ICD-10-CM | POA: Diagnosis not present

## 2017-11-06 DIAGNOSIS — I77819 Aortic ectasia, unspecified site: Secondary | ICD-10-CM | POA: Diagnosis not present

## 2017-11-06 DIAGNOSIS — E785 Hyperlipidemia, unspecified: Secondary | ICD-10-CM | POA: Diagnosis not present

## 2017-11-22 DIAGNOSIS — L821 Other seborrheic keratosis: Secondary | ICD-10-CM | POA: Diagnosis not present

## 2017-11-22 DIAGNOSIS — L578 Other skin changes due to chronic exposure to nonionizing radiation: Secondary | ICD-10-CM | POA: Diagnosis not present

## 2017-11-22 DIAGNOSIS — L570 Actinic keratosis: Secondary | ICD-10-CM | POA: Diagnosis not present

## 2017-12-24 DIAGNOSIS — M5416 Radiculopathy, lumbar region: Secondary | ICD-10-CM | POA: Diagnosis not present

## 2017-12-24 DIAGNOSIS — M1612 Unilateral primary osteoarthritis, left hip: Secondary | ICD-10-CM | POA: Diagnosis not present

## 2017-12-25 DIAGNOSIS — M545 Low back pain: Secondary | ICD-10-CM | POA: Diagnosis not present

## 2018-01-06 DIAGNOSIS — N200 Calculus of kidney: Secondary | ICD-10-CM | POA: Diagnosis not present

## 2018-01-07 DIAGNOSIS — N200 Calculus of kidney: Secondary | ICD-10-CM | POA: Diagnosis not present

## 2018-01-29 ENCOUNTER — Encounter: Payer: Self-pay | Admitting: Genetic Counselor

## 2018-01-29 ENCOUNTER — Ambulatory Visit (HOSPITAL_BASED_OUTPATIENT_CLINIC_OR_DEPARTMENT_OTHER): Payer: PPO | Admitting: Genetic Counselor

## 2018-01-29 ENCOUNTER — Inpatient Hospital Stay: Payer: PPO | Attending: Genetic Counselor

## 2018-01-29 DIAGNOSIS — Z8481 Family history of carrier of genetic disease: Secondary | ICD-10-CM

## 2018-01-29 DIAGNOSIS — Z800 Family history of malignant neoplasm of digestive organs: Secondary | ICD-10-CM | POA: Insufficient documentation

## 2018-01-29 DIAGNOSIS — Z8051 Family history of malignant neoplasm of kidney: Secondary | ICD-10-CM | POA: Diagnosis not present

## 2018-01-29 DIAGNOSIS — Z8052 Family history of malignant neoplasm of bladder: Secondary | ICD-10-CM | POA: Diagnosis not present

## 2018-01-29 NOTE — Progress Notes (Signed)
REFERRING PROVIDER: Isaias Sakai, [address] [place], [place] [address]  PRIMARY PROVIDER:  Philmore Pali, NP  PRIMARY REASON FOR VISIT:  1. Family history of bladder cancer   2. Family history of colon cancer   3. Family history of kidney cancer   4. Family history of genetic mutation for hereditary nonpolyposis colorectal cancer (HNPCC)      HISTORY OF PRESENT ILLNESS:   Ms. Butkiewicz, a 77 y.o. female, was seen for a [place] cancer genetics consultation due to a family history of cancer.  Ms. Hatcher presents to clinic today to discuss the possibility of a hereditary predisposition to cancer, genetic testing, and to further clarify her future cancer risks, as well as potential cancer risks for family members.   Ms. Hunkele is a 77 y.o. female with no personal history of cancer.  Her sister was diagnosed with bilateral urothelial cancer, and was referred for genetic testing.  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   CANCER HISTORY:   No history exists.     HORMONAL RISK FACTORS:  Menarche was at age 31.  First live birth at age 25.  OCP use for approximately 0 years.  Ovaries intact: unsure.  Hysterectomy: yes.  Menopausal status: postmenopausal.  HRT use: <1 years. Colonoscopy: yes; 10-12 polyps. Mammogram within the last year: yes. Number of breast biopsies: 0. Up to date with pelvic exams:  no. Any excessive radiation exposure in the past:  no  Past Medical History:  Diagnosis Date  . Family history of bladder cancer   . Family history of colon cancer   . Family history of genetic mutation for hereditary nonpolyposis colorectal cancer (HNPCC)   . Family history of kidney cancer      Social History   Socioeconomic History  . Marital status: Unknown    Spouse name: Not on file  . Number of children: Not on file  . Years of education: Not on file  . Highest education level: Not on file  Occupational  History  . Not on file  Social Needs  . Financial resource strain: Not on file  . Food insecurity:    Worry: Not on file    Inability: Not on file  . Transportation needs:    Medical: Not on file    Non-medical: Not on file  Tobacco Use  . Smoking status: Not on file  Substance and Sexual Activity  . Alcohol use: Not on file  . Drug use: Not on file  . Sexual activity: Not on file  Lifestyle  . Physical activity:    Days per week: Not on file    Minutes per session: Not on file  . Stress: Not on file  Relationships  . Social connections:    Talks on phone: Not on file    Gets together: Not on file    Attends religious service: Not on file    Active member of club or organization: Not on file    Attends meetings of clubs or organizations: Not on file    Relationship status: Not on file  Other Topics Concern  . Not on file  Social History Narrative  . Not on file     FAMILY HISTORY:  We obtained a detailed, 4-generation family history.  Significant diagnoses are listed below: Family History  Problem Relation Age of Onset  . Colon cancer Mother 44       d. 65  .  Tuberculosis Father   . Bladder Cancer Sister 108       bilateral urothelial  . Bladder Cancer Brother 51       urothelial  . Diabetes Maternal Aunt   . Bladder Cancer Brother        urothelial  . Kidney cancer Brother   . Bladder Cancer Other        urothelial, d. 26 - niece  . Schizophrenia Other   . Colon cancer Other        dx twice, under 5 and over 64; nephew  . Testicular cancer Other 24       grand-nephew    The patient has a son and two daughters who are cancer free.  She has two sisters and four brothers.  Two brothers have had urothelial cancer, one of which also had kidney cancer.  Another brother has a grandson with testicular cancer.  A sister was diagnosed with bilateral urothelial cancer and had a daughter with urothelial cancer.  This sister was found to have Lynch syndrome.  A second  sister has a son who had colon cancer twice, the first time under age 38.  The patient's parents are both deceased.  The patient's mother had colon cancer and died at 72.  She had three sisters and two brothers.  One sister had an unknown form of cancer.  There are two maternal cousins with cancer, one of which may have had breast cancer.  The maternal grandparents are deceased.  The patients father died of black lung and TB.  He had two sisters and a brother.  Both paternal grandparents are deceased.  The grandparents are reportedly related to each other.  Ms. Miltner is aware of previous family history of genetic testing for hereditary cancer risks. Patient's maternal ancestors are of [place] and [place] descent, and paternal ancestors are of [place] descent. There is no reported Ashkenazi Jewish ancestry. There is no known consanguinity.  GENETIC COUNSELING ASSESSMENT: JENNAYA POGUE is a 77 y.o. 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 We, therefore, discussed and recommended the following at today's visit.   DISCUSSION: We discussed that about 3-5% of colon cancer is due to Lynch syndrome.  There are five genes associated with Lynch syndrome, and the cancer risk is dependant on the gene in which there is a mutation. The patient's sister was diagnosed with bilateral urothelial cancer, and due to the family history, she was then tested and found to have Lynch syndrome.  Ms. Kuehl has a 50% chance of also having this same mutation. Her sister also has a variant of unknown significance in a gene called MSH3. This gene is a recessive cause of polyposis.  The MSH3 VUS should not be used to change medical management.  We reviewed the characteristics, features and inheritance patterns of hereditary cancer syndromes. We also discussed genetic testing, including the appropriate family members to test, the process  of testing, insurance coverage and turn-around-time for results. We discussed the implications of a negative, positive and/or variant of uncertain significant result. We recommended Ms. Barrientez pursue genetic testing for the MSH6 gene mutation and MSH3 VUS in the family.   The testing laboratory will cover genetic testing on any family member who is tested within 38 days of the report date.  Ms. Heng's test falls within this time frame.  PLAN: After considering the risks, benefits, and limitations, Ms. Drue Flirt  provided informed consent to pursue genetic testing and the blood sample was sent to [facility] for analysis of the MSH6 and MSH3 gene changes. Results should be available within approximately 2-3 weeks' time, at which point they will be disclosed by telephone to Ms. Shanker, as will any additional recommendations warranted by these results. Ms. Strauser will receive a summary of her genetic counseling visit and a copy of her results once available. This information will also be available in Epic. We encouraged Ms. Lennox to remain in contact with cancer genetics annually so that we can continuously update the family history and inform her of any changes in cancer genetics and testing that may be of benefit for her family. Ms. Banbury questions were answered to her satisfaction today. Our contact information was provided should additional questions or concerns arise.  Lastly, we encouraged Ms. Schwalbe to remain in contact with cancer genetics annually so that we can continuously update the family history and inform her of any changes in cancer genetics and testing that may be of benefit for this family.   Ms.  Stotz questions were answered to her satisfaction today. Our contact information was provided should additional questions or concerns arise. Thank you for the referral and allowing [place] to share in the care of your patient.   Karen P. Florene Glen, [place], [facility] Certified Genetic  Counselor [email or web address] phone: [phone]  The patient was seen for a total of 60 minutes in face-to-face genetic counseling.  This patient was discussed with Drs. Magrinat, Lindi Adie and/or Burr Medico who agrees with the above.    _______________________________________________________________________ For Office Staff:  Number of people involved in session: 3 Was an Intern/ student involved with case: no

## 2018-02-05 DIAGNOSIS — M545 Low back pain: Secondary | ICD-10-CM | POA: Diagnosis not present

## 2018-02-05 DIAGNOSIS — M5416 Radiculopathy, lumbar region: Secondary | ICD-10-CM | POA: Diagnosis not present

## 2018-02-05 DIAGNOSIS — M533 Sacrococcygeal disorders, not elsewhere classified: Secondary | ICD-10-CM | POA: Diagnosis not present

## 2018-02-11 ENCOUNTER — Encounter: Payer: Self-pay | Admitting: Genetic Counselor

## 2018-02-11 DIAGNOSIS — Z1379 Encounter for other screening for genetic and chromosomal anomalies: Secondary | ICD-10-CM | POA: Insufficient documentation

## 2018-02-13 DIAGNOSIS — M4727 Other spondylosis with radiculopathy, lumbosacral region: Secondary | ICD-10-CM | POA: Diagnosis not present

## 2018-02-13 DIAGNOSIS — M5416 Radiculopathy, lumbar region: Secondary | ICD-10-CM | POA: Diagnosis not present

## 2018-02-13 DIAGNOSIS — M4716 Other spondylosis with myelopathy, lumbar region: Secondary | ICD-10-CM | POA: Diagnosis not present

## 2018-03-14 DIAGNOSIS — Z1231 Encounter for screening mammogram for malignant neoplasm of breast: Secondary | ICD-10-CM | POA: Diagnosis not present

## 2018-04-02 DIAGNOSIS — M5416 Radiculopathy, lumbar region: Secondary | ICD-10-CM | POA: Diagnosis not present

## 2018-04-03 DIAGNOSIS — E785 Hyperlipidemia, unspecified: Secondary | ICD-10-CM | POA: Diagnosis not present

## 2018-04-03 DIAGNOSIS — I77819 Aortic ectasia, unspecified site: Secondary | ICD-10-CM | POA: Diagnosis not present

## 2018-04-03 DIAGNOSIS — R7303 Prediabetes: Secondary | ICD-10-CM | POA: Diagnosis not present

## 2018-04-03 DIAGNOSIS — M2550 Pain in unspecified joint: Secondary | ICD-10-CM | POA: Diagnosis not present

## 2018-04-03 DIAGNOSIS — Z6837 Body mass index (BMI) 37.0-37.9, adult: Secondary | ICD-10-CM | POA: Diagnosis not present

## 2018-04-08 DIAGNOSIS — M5416 Radiculopathy, lumbar region: Secondary | ICD-10-CM | POA: Diagnosis not present

## 2018-04-08 DIAGNOSIS — M4727 Other spondylosis with radiculopathy, lumbosacral region: Secondary | ICD-10-CM | POA: Diagnosis not present

## 2018-05-09 DIAGNOSIS — L728 Other follicular cysts of the skin and subcutaneous tissue: Secondary | ICD-10-CM | POA: Diagnosis not present

## 2018-05-09 DIAGNOSIS — L570 Actinic keratosis: Secondary | ICD-10-CM | POA: Diagnosis not present

## 2018-05-09 DIAGNOSIS — L820 Inflamed seborrheic keratosis: Secondary | ICD-10-CM | POA: Diagnosis not present

## 2018-05-13 DIAGNOSIS — E785 Hyperlipidemia, unspecified: Secondary | ICD-10-CM | POA: Diagnosis not present

## 2018-05-13 DIAGNOSIS — Z79899 Other long term (current) drug therapy: Secondary | ICD-10-CM | POA: Diagnosis not present

## 2018-05-13 DIAGNOSIS — E039 Hypothyroidism, unspecified: Secondary | ICD-10-CM | POA: Diagnosis not present

## 2018-05-26 DIAGNOSIS — I6529 Occlusion and stenosis of unspecified carotid artery: Secondary | ICD-10-CM | POA: Diagnosis not present

## 2018-05-26 DIAGNOSIS — E785 Hyperlipidemia, unspecified: Secondary | ICD-10-CM | POA: Diagnosis not present

## 2018-05-26 DIAGNOSIS — E039 Hypothyroidism, unspecified: Secondary | ICD-10-CM | POA: Diagnosis not present

## 2018-05-26 DIAGNOSIS — I77819 Aortic ectasia, unspecified site: Secondary | ICD-10-CM | POA: Diagnosis not present

## 2018-05-26 DIAGNOSIS — Z6837 Body mass index (BMI) 37.0-37.9, adult: Secondary | ICD-10-CM | POA: Diagnosis not present

## 2018-06-03 DIAGNOSIS — M545 Low back pain: Secondary | ICD-10-CM | POA: Diagnosis not present

## 2018-06-12 DIAGNOSIS — J209 Acute bronchitis, unspecified: Secondary | ICD-10-CM | POA: Diagnosis not present

## 2018-06-18 ENCOUNTER — Other Ambulatory Visit: Payer: Self-pay

## 2018-06-18 DIAGNOSIS — I6523 Occlusion and stenosis of bilateral carotid arteries: Secondary | ICD-10-CM

## 2018-07-07 DIAGNOSIS — E039 Hypothyroidism, unspecified: Secondary | ICD-10-CM | POA: Diagnosis not present

## 2018-07-10 ENCOUNTER — Ambulatory Visit (INDEPENDENT_AMBULATORY_CARE_PROVIDER_SITE_OTHER): Payer: PPO | Admitting: Vascular Surgery

## 2018-07-10 ENCOUNTER — Ambulatory Visit (HOSPITAL_COMMUNITY)
Admission: RE | Admit: 2018-07-10 | Discharge: 2018-07-10 | Disposition: A | Discharge status: Home / Self Care | Payer: PPO | Source: Ambulatory Visit | Attending: Family | Admitting: Family

## 2018-07-10 ENCOUNTER — Encounter: Payer: Self-pay | Admitting: Vascular Surgery

## 2018-07-10 ENCOUNTER — Other Ambulatory Visit: Payer: Self-pay

## 2018-07-10 VITALS — BP 115/70 | HR 106 | Temp 97.4°F | Resp 18 | Ht 62.5 in | Wt 206.0 lb

## 2018-07-10 DIAGNOSIS — I6523 Occlusion and stenosis of bilateral carotid arteries: Secondary | ICD-10-CM | POA: Diagnosis not present

## 2018-07-10 DIAGNOSIS — I77811 Abdominal aortic ectasia: Secondary | ICD-10-CM | POA: Diagnosis not present

## 2018-07-10 NOTE — Progress Notes (Signed)
Referring Physician: Charlott Holler PA  Patient name: Kaitlyn Martin MRN: [id number] DOB: December 08, 1940 Sex: female  REASON FOR CONSULT: Carotid stenosis, aortic ectasia  HPI: Kaitlyn Martin is a 78 y.o. female, who is status post left carotid endarterectomy in January 2015 and right carotid endarterectomy January 2014 at [facility].  These were both for asymptomatic carotid stenoses.  Patient denies any episodes of TIA amaurosis or stroke.  She states she has had some trouble with vision in her left eye recently.  But this sounds more like blurriness than amaurosis.  She states her eye doctor looked at her left eye recently and noticed no problems.  She also has a history of aortic ectasia.  She had a CT scan in 2018 which showed her aortic diameter was 2.9 cm.  She does have a brother with a known history of abdominal aortic aneurysm that was repaired in the past.  She is not currently on aspirin.  She states she did not take any aspirin because she is on Celebrex.  She does state that she thinks she may have had some blood in her stool recently but did not have this confirmed.  She is not currently on any H2 or proton pump inhibitor.  She is on simvastatin.  Other medical problems include multi-joint arthritis.  Past Medical History:  Diagnosis Date  . Family history of bladder cancer   . Family history of colon cancer   . Family history of genetic mutation for hereditary nonpolyposis colorectal cancer (HNPCC)   . Family history of kidney cancer     Past surgical history: Cholecystectomy, bilateral carotid endarterectomy  Family History  Problem Relation Age of Onset  . Colon cancer Mother 29       d. 31  . Tuberculosis Father   . Bladder Cancer Sister 58       bilateral urothelial  . Bladder Cancer Brother 4       urothelial  . Diabetes Maternal Aunt   . Bladder Cancer Brother        urothelial  . Kidney cancer Brother   . Bladder Cancer Other        urothelial, d. 91 -  niece  . Schizophrenia Other   . Colon cancer Other        dx twice, under 42 and over 49; nephew  . Testicular cancer Other 24       grand-nephew    SOCIAL HISTORY: Social History   Socioeconomic History  . Marital status: Unknown    Spouse name: Not on file  . Number of children: Not on file  . Years of education: Not on file  . Highest education level: Not on file  Occupational History  . Not on file  Social Needs  . Financial resource strain: Not on file  . Food insecurity:    Worry: Not on file    Inability: Not on file  . Transportation needs:    Medical: Not on file    Non-medical: Not on file  Tobacco Use  . Smoking status: Former Smoker    Last attempt to quit: 05/28/1998    Years since quitting: 20.1  . Smokeless tobacco: Never Used  Substance and Sexual Activity  . Alcohol use: Never    Frequency: Never  . Drug use: Not on file  . Sexual activity: Not on file  Lifestyle  . Physical activity:    Days per week: Not on file    Minutes per  session: Not on file  . Stress: Not on file  Relationships  . Social connections:    Talks on phone: Not on file    Gets together: Not on file    Attends religious service: Not on file    Active member of club or organization: Not on file    Attends meetings of clubs or organizations: Not on file    Relationship status: Not on file  . Intimate partner violence:    Fear of current or ex partner: Not on file    Emotionally abused: Not on file    Physically abused: Not on file    Forced sexual activity: Not on file  Other Topics Concern  . Not on file  Social History Narrative  . Not on file    Allergies  Allergen Reactions  . Codeine     Nausea   . Penicillins     Difficulty swallowing, difficulty breathing, hives    Current Outpatient Medications  Medication Sig Dispense Refill  . celecoxib (CELEBREX) 200 MG capsule Take 200 mg by mouth 2 (two) times daily.    Marland Kitchen levothyroxine (SYNTHROID, LEVOTHROID) 25  MCG tablet Take 25 mcg by mouth daily before breakfast.    . Nutritional Supplements (QUINOA/KALE/HEMP) LIQD Take by mouth.    . simvastatin (ZOCOR) 40 MG tablet Take 40 mg by mouth daily.    Marland Kitchen triamcinolone cream (KENALOG) 0.5 % Apply 1 application topically 2 (two) times daily.     No current facility-administered medications for this visit.    Simvastatin ROS:   General:  No weight loss, Fever, chills  HEENT: No recent headaches, no nasal bleeding, no visual changes, no sore throat  Neurologic: No dizziness, blackouts, seizures. No recent symptoms of stroke or mini- stroke. No recent episodes of slurred speech, or temporary blindness.  Cardiac: No recent episodes of chest pain/pressure, no shortness of breath at rest.  No shortness of breath with exertion.  Denies history of atrial fibrillation or irregular heartbeat  Vascular: No history of rest pain in feet.  No history of claudication.  No history of non-healing ulcer, No history of DVT   Pulmonary: No home oxygen, no productive cough, no hemoptysis,  No asthma or wheezing  Musculoskeletal:  [X]  Arthritis, [ ]  Low back pain,  [X]  Joint pain  Hematologic:No history of hypercoagulable state.  No history of easy bleeding.  No history of anemia  Gastrointestinal: No hematochezia or melena,  No gastroesophageal reflux, no trouble swallowing  Urinary: [ ]  chronic Kidney disease, [ ]  on HD - [ ]  MWF or [ ]  TTHS, [ ]  Burning with urination, [ ]  Frequent urination, [ ]  Difficulty urinating;   Skin: No rashes  Psychological: No history of anxiety,  No history of depression   Physical Examination  Vitals:   07/10/18 1210 07/10/18 1215  BP: 126/72 115/70  Pulse: (!) 106   Resp: 18   Temp: (!) 97.4 F (36.3 C)   TempSrc: Oral   SpO2: 100%   Weight: 206 lb (93.4 kg)   Height: 5' 2.5" (1.588 m)     Body mass index is 37.08 kg/m.  General:  Alert and oriented, no acute distress HEENT: Normal Neck: No bruit or  JVD Pulmonary: Clear to auscultation bilaterally Cardiac: Regular Rate and Rhythm without murmur Abdomen: Soft, non-tender, non-distended, no mass Skin: No rash Extremity Pulses:  2+ radial, brachial pulses bilaterally Musculoskeletal: No deformity or edema  Neurologic: Upper and lower extremity motor 5/5 and symmetric,  facial asymmetry  DATA:  Had a carotid duplex exam today which I reviewed and interpreted.  Left side showed no significant carotid stenosis.  Right side was 40 to 60%.  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  I discussed with patient today that she should be on aspirin 81 mg once daily in addition to her statin for prevention of stroke and cardiac disease long-term.  She will start an 81 mg aspirin daily today.  She states she may have had some dark stools I told her to discuss this more thoroughly with her primary provider.  I will leave it at their discretion whether or not to start her on H2 blocker proton pump inhibitor since she will now be on Celebrex and aspirin combo.  However, believe the risk of bleeding overall certainly is much less than her risk of cardiovascular events.  We will obtain a bilateral carotid duplex scan in 1 year.  2.  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  We will obtain an ultrasound of her abdominal aorta when she returns for follow-up in 1 year.   PLAN: Patient will be scheduled for follow-up in 1 year with our nurse practitioner and bilateral carotid duplex scan and aortic ultrasound at that time.  She will also start aspirin 81 mg daily.   Ruta Hinds, MD [facility] [place] Office: [phone] Pager: [phone]

## 2018-07-23 ENCOUNTER — Encounter: Payer: Self-pay | Admitting: Surgery

## 2018-07-28 DIAGNOSIS — K2900 Acute gastritis without bleeding: Secondary | ICD-10-CM | POA: Diagnosis not present

## 2018-07-28 DIAGNOSIS — N390 Urinary tract infection, site not specified: Secondary | ICD-10-CM | POA: Diagnosis not present

## 2018-07-28 DIAGNOSIS — Z6835 Body mass index (BMI) 35.0-35.9, adult: Secondary | ICD-10-CM | POA: Diagnosis not present

## 2018-07-30 DIAGNOSIS — N390 Urinary tract infection, site not specified: Secondary | ICD-10-CM | POA: Diagnosis not present

## 2018-09-02 DIAGNOSIS — C541 Malignant neoplasm of endometrium: Secondary | ICD-10-CM | POA: Diagnosis not present

## 2018-09-02 DIAGNOSIS — M545 Low back pain: Secondary | ICD-10-CM | POA: Diagnosis not present

## 2018-10-06 DIAGNOSIS — S41111A Laceration without foreign body of right upper arm, initial encounter: Secondary | ICD-10-CM | POA: Diagnosis not present

## 2018-10-06 DIAGNOSIS — Z6835 Body mass index (BMI) 35.0-35.9, adult: Secondary | ICD-10-CM | POA: Diagnosis not present

## 2018-11-03 DIAGNOSIS — M5416 Radiculopathy, lumbar region: Secondary | ICD-10-CM | POA: Diagnosis not present

## 2018-11-03 DIAGNOSIS — G894 Chronic pain syndrome: Secondary | ICD-10-CM | POA: Diagnosis not present

## 2018-11-03 DIAGNOSIS — M961 Postlaminectomy syndrome, not elsewhere classified: Secondary | ICD-10-CM | POA: Diagnosis not present

## 2018-11-03 DIAGNOSIS — M47816 Spondylosis without myelopathy or radiculopathy, lumbar region: Secondary | ICD-10-CM | POA: Diagnosis not present

## 2018-11-03 DIAGNOSIS — M549 Dorsalgia, unspecified: Secondary | ICD-10-CM | POA: Diagnosis not present

## 2018-11-03 DIAGNOSIS — M545 Low back pain: Secondary | ICD-10-CM | POA: Diagnosis not present

## 2018-11-03 DIAGNOSIS — R202 Paresthesia of skin: Secondary | ICD-10-CM | POA: Diagnosis not present

## 2018-11-03 DIAGNOSIS — M5136 Other intervertebral disc degeneration, lumbar region: Secondary | ICD-10-CM | POA: Diagnosis not present

## 2018-11-17 DIAGNOSIS — H35371 Puckering of macula, right eye: Secondary | ICD-10-CM | POA: Diagnosis not present

## 2018-11-18 DIAGNOSIS — R600 Localized edema: Secondary | ICD-10-CM | POA: Diagnosis not present

## 2018-11-18 DIAGNOSIS — I8310 Varicose veins of unspecified lower extremity with inflammation: Secondary | ICD-10-CM | POA: Diagnosis not present

## 2018-12-01 DIAGNOSIS — R202 Paresthesia of skin: Secondary | ICD-10-CM | POA: Diagnosis not present

## 2018-12-01 DIAGNOSIS — M47816 Spondylosis without myelopathy or radiculopathy, lumbar region: Secondary | ICD-10-CM | POA: Diagnosis not present

## 2018-12-01 DIAGNOSIS — M5416 Radiculopathy, lumbar region: Secondary | ICD-10-CM | POA: Diagnosis not present

## 2018-12-01 DIAGNOSIS — M549 Dorsalgia, unspecified: Secondary | ICD-10-CM | POA: Diagnosis not present

## 2018-12-01 DIAGNOSIS — M5136 Other intervertebral disc degeneration, lumbar region: Secondary | ICD-10-CM | POA: Diagnosis not present

## 2018-12-01 DIAGNOSIS — M545 Low back pain: Secondary | ICD-10-CM | POA: Diagnosis not present

## 2018-12-01 DIAGNOSIS — Z1389 Encounter for screening for other disorder: Secondary | ICD-10-CM | POA: Diagnosis not present

## 2018-12-01 DIAGNOSIS — M961 Postlaminectomy syndrome, not elsewhere classified: Secondary | ICD-10-CM | POA: Diagnosis not present

## 2018-12-01 DIAGNOSIS — G894 Chronic pain syndrome: Secondary | ICD-10-CM | POA: Diagnosis not present

## 2018-12-25 ENCOUNTER — Other Ambulatory Visit: Payer: Self-pay

## 2018-12-29 DIAGNOSIS — G894 Chronic pain syndrome: Secondary | ICD-10-CM | POA: Diagnosis not present

## 2018-12-29 DIAGNOSIS — M5416 Radiculopathy, lumbar region: Secondary | ICD-10-CM | POA: Diagnosis not present

## 2018-12-29 DIAGNOSIS — M5136 Other intervertebral disc degeneration, lumbar region: Secondary | ICD-10-CM | POA: Diagnosis not present

## 2018-12-29 DIAGNOSIS — Z1389 Encounter for screening for other disorder: Secondary | ICD-10-CM | POA: Diagnosis not present

## 2018-12-29 DIAGNOSIS — M961 Postlaminectomy syndrome, not elsewhere classified: Secondary | ICD-10-CM | POA: Diagnosis not present

## 2018-12-29 DIAGNOSIS — M47816 Spondylosis without myelopathy or radiculopathy, lumbar region: Secondary | ICD-10-CM | POA: Diagnosis not present

## 2018-12-29 DIAGNOSIS — M545 Low back pain: Secondary | ICD-10-CM | POA: Diagnosis not present

## 2019-01-08 DIAGNOSIS — M79674 Pain in right toe(s): Secondary | ICD-10-CM | POA: Insufficient documentation

## 2019-01-26 DIAGNOSIS — M5416 Radiculopathy, lumbar region: Secondary | ICD-10-CM | POA: Diagnosis not present

## 2019-01-26 DIAGNOSIS — Z1389 Encounter for screening for other disorder: Secondary | ICD-10-CM | POA: Diagnosis not present

## 2019-01-26 DIAGNOSIS — M961 Postlaminectomy syndrome, not elsewhere classified: Secondary | ICD-10-CM | POA: Diagnosis not present

## 2019-01-26 DIAGNOSIS — M47816 Spondylosis without myelopathy or radiculopathy, lumbar region: Secondary | ICD-10-CM | POA: Diagnosis not present

## 2019-01-26 DIAGNOSIS — G894 Chronic pain syndrome: Secondary | ICD-10-CM | POA: Diagnosis not present

## 2019-01-26 DIAGNOSIS — M545 Low back pain: Secondary | ICD-10-CM | POA: Diagnosis not present

## 2019-01-26 DIAGNOSIS — M5136 Other intervertebral disc degeneration, lumbar region: Secondary | ICD-10-CM | POA: Diagnosis not present

## 2019-02-23 DIAGNOSIS — M5136 Other intervertebral disc degeneration, lumbar region: Secondary | ICD-10-CM | POA: Diagnosis not present

## 2019-02-23 DIAGNOSIS — M961 Postlaminectomy syndrome, not elsewhere classified: Secondary | ICD-10-CM | POA: Diagnosis not present

## 2019-02-23 DIAGNOSIS — Z1389 Encounter for screening for other disorder: Secondary | ICD-10-CM | POA: Diagnosis not present

## 2019-02-23 DIAGNOSIS — M47816 Spondylosis without myelopathy or radiculopathy, lumbar region: Secondary | ICD-10-CM | POA: Diagnosis not present

## 2019-02-23 DIAGNOSIS — M5416 Radiculopathy, lumbar region: Secondary | ICD-10-CM | POA: Diagnosis not present

## 2019-02-23 DIAGNOSIS — M545 Low back pain: Secondary | ICD-10-CM | POA: Diagnosis not present

## 2019-02-23 DIAGNOSIS — G894 Chronic pain syndrome: Secondary | ICD-10-CM | POA: Diagnosis not present

## 2019-03-23 DIAGNOSIS — Z1389 Encounter for screening for other disorder: Secondary | ICD-10-CM | POA: Diagnosis not present

## 2019-03-23 DIAGNOSIS — M5136 Other intervertebral disc degeneration, lumbar region: Secondary | ICD-10-CM | POA: Diagnosis not present

## 2019-03-23 DIAGNOSIS — M545 Low back pain: Secondary | ICD-10-CM | POA: Diagnosis not present

## 2019-03-23 DIAGNOSIS — M961 Postlaminectomy syndrome, not elsewhere classified: Secondary | ICD-10-CM | POA: Diagnosis not present

## 2019-03-23 DIAGNOSIS — G894 Chronic pain syndrome: Secondary | ICD-10-CM | POA: Diagnosis not present

## 2019-03-23 DIAGNOSIS — M47816 Spondylosis without myelopathy or radiculopathy, lumbar region: Secondary | ICD-10-CM | POA: Diagnosis not present

## 2019-03-23 DIAGNOSIS — M5416 Radiculopathy, lumbar region: Secondary | ICD-10-CM | POA: Diagnosis not present

## 2019-04-14 DIAGNOSIS — M79674 Pain in right toe(s): Secondary | ICD-10-CM | POA: Diagnosis not present

## 2019-04-14 DIAGNOSIS — L309 Dermatitis, unspecified: Secondary | ICD-10-CM | POA: Insufficient documentation

## 2019-04-14 DIAGNOSIS — L308 Other specified dermatitis: Secondary | ICD-10-CM | POA: Diagnosis not present

## 2019-04-20 DIAGNOSIS — M5136 Other intervertebral disc degeneration, lumbar region: Secondary | ICD-10-CM | POA: Diagnosis not present

## 2019-04-20 DIAGNOSIS — M5416 Radiculopathy, lumbar region: Secondary | ICD-10-CM | POA: Diagnosis not present

## 2019-04-20 DIAGNOSIS — M961 Postlaminectomy syndrome, not elsewhere classified: Secondary | ICD-10-CM | POA: Diagnosis not present

## 2019-04-20 DIAGNOSIS — Z1389 Encounter for screening for other disorder: Secondary | ICD-10-CM | POA: Diagnosis not present

## 2019-04-20 DIAGNOSIS — M47816 Spondylosis without myelopathy or radiculopathy, lumbar region: Secondary | ICD-10-CM | POA: Diagnosis not present

## 2019-04-20 DIAGNOSIS — G894 Chronic pain syndrome: Secondary | ICD-10-CM | POA: Diagnosis not present

## 2019-04-20 DIAGNOSIS — M545 Low back pain: Secondary | ICD-10-CM | POA: Diagnosis not present

## 2019-05-14 DIAGNOSIS — M79674 Pain in right toe(s): Secondary | ICD-10-CM | POA: Diagnosis not present

## 2019-05-14 DIAGNOSIS — L308 Other specified dermatitis: Secondary | ICD-10-CM | POA: Diagnosis not present

## 2019-05-25 DIAGNOSIS — M5136 Other intervertebral disc degeneration, lumbar region: Secondary | ICD-10-CM | POA: Diagnosis not present

## 2019-05-25 DIAGNOSIS — G894 Chronic pain syndrome: Secondary | ICD-10-CM | POA: Diagnosis not present

## 2019-05-25 DIAGNOSIS — M961 Postlaminectomy syndrome, not elsewhere classified: Secondary | ICD-10-CM | POA: Diagnosis not present

## 2019-05-25 DIAGNOSIS — M545 Low back pain: Secondary | ICD-10-CM | POA: Diagnosis not present

## 2019-05-25 DIAGNOSIS — Z1389 Encounter for screening for other disorder: Secondary | ICD-10-CM | POA: Diagnosis not present

## 2019-05-25 DIAGNOSIS — M47816 Spondylosis without myelopathy or radiculopathy, lumbar region: Secondary | ICD-10-CM | POA: Diagnosis not present

## 2019-05-25 DIAGNOSIS — M5416 Radiculopathy, lumbar region: Secondary | ICD-10-CM | POA: Diagnosis not present

## 2019-07-28 ENCOUNTER — Other Ambulatory Visit: Payer: Self-pay | Admitting: *Deleted

## 2019-07-28 DIAGNOSIS — I6523 Occlusion and stenosis of bilateral carotid arteries: Secondary | ICD-10-CM

## 2019-07-28 DIAGNOSIS — R69 Illness, unspecified: Secondary | ICD-10-CM | POA: Diagnosis not present

## 2019-07-28 DIAGNOSIS — I77811 Abdominal aortic ectasia: Secondary | ICD-10-CM

## 2019-07-29 DIAGNOSIS — I77819 Aortic ectasia, unspecified site: Secondary | ICD-10-CM | POA: Diagnosis not present

## 2019-07-29 DIAGNOSIS — E785 Hyperlipidemia, unspecified: Secondary | ICD-10-CM | POA: Diagnosis not present

## 2019-07-29 DIAGNOSIS — Z139 Encounter for screening, unspecified: Secondary | ICD-10-CM | POA: Diagnosis not present

## 2019-07-29 DIAGNOSIS — Z6831 Body mass index (BMI) 31.0-31.9, adult: Secondary | ICD-10-CM | POA: Diagnosis not present

## 2019-07-29 DIAGNOSIS — I6529 Occlusion and stenosis of unspecified carotid artery: Secondary | ICD-10-CM | POA: Diagnosis not present

## 2019-07-29 DIAGNOSIS — E039 Hypothyroidism, unspecified: Secondary | ICD-10-CM | POA: Diagnosis not present

## 2019-07-29 DIAGNOSIS — Z1331 Encounter for screening for depression: Secondary | ICD-10-CM | POA: Diagnosis not present

## 2019-07-30 ENCOUNTER — Ambulatory Visit (INDEPENDENT_AMBULATORY_CARE_PROVIDER_SITE_OTHER): Payer: Medicare HMO | Admitting: Physician Assistant

## 2019-07-30 ENCOUNTER — Other Ambulatory Visit: Payer: Self-pay

## 2019-07-30 ENCOUNTER — Ambulatory Visit (INDEPENDENT_AMBULATORY_CARE_PROVIDER_SITE_OTHER)
Admission: RE | Admit: 2019-07-30 | Discharge: 2019-07-30 | Disposition: A | Discharge status: Home / Self Care | Payer: Medicare HMO | Source: Ambulatory Visit | Attending: Surgery | Admitting: Surgery

## 2019-07-30 ENCOUNTER — Ambulatory Visit (HOSPITAL_COMMUNITY)
Admission: RE | Admit: 2019-07-30 | Discharge: 2019-07-30 | Disposition: A | Discharge status: Home / Self Care | Payer: Medicare HMO | Source: Ambulatory Visit | Attending: Surgery | Admitting: Surgery

## 2019-07-30 DIAGNOSIS — I6523 Occlusion and stenosis of bilateral carotid arteries: Secondary | ICD-10-CM

## 2019-07-30 DIAGNOSIS — I77811 Abdominal aortic ectasia: Secondary | ICD-10-CM | POA: Diagnosis not present

## 2019-07-30 DIAGNOSIS — I6529 Occlusion and stenosis of unspecified carotid artery: Secondary | ICD-10-CM | POA: Insufficient documentation

## 2019-07-30 NOTE — Progress Notes (Signed)
    Established Carotid Patient   History of Present Illness   Kaitlyn Martin is a 79 y.o. ([birth year]) female who presents to go over vascular studies related to carotid artery stenosis and abdominal aortic aneurysm surveillance.  Surgical history significant for left carotid endarterectomy in January 2015 and right carotid endarterectomy in January 2014 at [facility].  Both surgeries were performed for asymptomatic carotid stenosis.  In the past year she denies any diagnosis of CVA or TIA.  She also denies any strokelike symptoms including slurring speech, one-sided weakness, or drastic changes in vision.  She does have some progressive left eye blurry vision however this is followed by her eye doctor who she will see later this month.  She is taking an aspirin and statin daily.  She is also followed for aortic ectasia.  She denies new or changing abdominal or back pain.  She also denies any claudication, rest pain, or tissue discoloration of bilateral lower extremities.  She denies tobacco use.  The patient's PMH, PSH, SH, and FamHx were reviewed and are unchanged from prior visit.  Current Outpatient Medications  Medication Sig Dispense Refill  . celecoxib (CELEBREX) 200 MG capsule Take 200 mg by mouth 2 (two) times daily.    Marland Kitchen levothyroxine (SYNTHROID, LEVOTHROID) 25 MCG tablet Take 25 mcg by mouth daily before breakfast.    . Nutritional Supplements (QUINOA/KALE/HEMP) LIQD Take by mouth.    . simvastatin (ZOCOR) 40 MG tablet Take 40 mg by mouth daily.    Marland Kitchen triamcinolone cream (KENALOG) 0.5 % Apply 1 application topically 2 (two) times daily.     No current facility-administered medications for this visit.    On ROS today: 10 system ROS negative unless otherwise noted in HPI   Physical Examination   Vitals:   07/30/19 0953  BP: (!) 151/70  Pulse: 60  Temp: 97.8 F (36.6 C)  TempSrc: Oral  SpO2: 98%  Weight: 177 lb 1.6 oz (80.3 kg)  Height: 5' 2.5" (1.588 m)   Body  mass index is 31.88 kg/m.  General Alert, O x 3, WD, NAD  Neck Supple, mid-line trachea, Neck incision healed  Pulmonary Sym exp, good B air movt  Cardiac RRR, Nl S1, S2,  Gastro- intestinal soft, non-distended, non-tender to palpation,   Musculo- skeletal M/S 5/5 throughout  , Extremities without ischemic changes    Neurologic Cranial nerves 2-12 intact    Non-Invasive Vascular Imaging   B Carotid Duplex :   R ICA stenosis:  40-59%  R VA:  patent and antegrade  L ICA stenosis:  40-59%  L VA:   patent and antegrade  AAA duplex  3.4cm ectatic mid aorta   Medical Decision Making   Kaitlyn Martin is a 79 y.o. female who presents for surveillance of carotid stenosis and ectatic aorta   Carotid duplex demonstrates bilateral ICA 40 to 59% stenosis  Chronic, progressive blurry vision unlikely related; follow-up with optometrist as scheduled  Recheck carotid duplex in 1 year   Aorta is ectatic with the mid segment measuring now 3.4 cm  Recheck AAA duplex in 1 year  Continue aspirin and statin daily   Dagoberto Ligas PA-C [facility] [place] Office: [phone]  Clinic MD: Oneida Alar

## 2019-07-31 ENCOUNTER — Other Ambulatory Visit: Payer: Self-pay | Admitting: *Deleted

## 2019-07-31 DIAGNOSIS — I6523 Occlusion and stenosis of bilateral carotid arteries: Secondary | ICD-10-CM

## 2019-07-31 DIAGNOSIS — I77811 Abdominal aortic ectasia: Secondary | ICD-10-CM

## 2019-08-31 DIAGNOSIS — M8589 Other specified disorders of bone density and structure, multiple sites: Secondary | ICD-10-CM | POA: Diagnosis not present

## 2019-08-31 DIAGNOSIS — E2839 Other primary ovarian failure: Secondary | ICD-10-CM | POA: Diagnosis not present

## 2019-08-31 DIAGNOSIS — M85832 Other specified disorders of bone density and structure, left forearm: Secondary | ICD-10-CM | POA: Diagnosis not present

## 2019-08-31 DIAGNOSIS — Z1231 Encounter for screening mammogram for malignant neoplasm of breast: Secondary | ICD-10-CM | POA: Diagnosis not present

## 2019-10-07 DIAGNOSIS — N9489 Other specified conditions associated with female genital organs and menstrual cycle: Secondary | ICD-10-CM | POA: Diagnosis not present

## 2019-10-07 DIAGNOSIS — N952 Postmenopausal atrophic vaginitis: Secondary | ICD-10-CM | POA: Diagnosis not present

## 2019-10-07 DIAGNOSIS — R3989 Other symptoms and signs involving the genitourinary system: Secondary | ICD-10-CM | POA: Diagnosis not present

## 2019-10-07 DIAGNOSIS — R351 Nocturia: Secondary | ICD-10-CM | POA: Diagnosis not present

## 2019-10-07 DIAGNOSIS — N8111 Cystocele, midline: Secondary | ICD-10-CM | POA: Insufficient documentation

## 2019-10-07 DIAGNOSIS — R82998 Other abnormal findings in urine: Secondary | ICD-10-CM | POA: Diagnosis not present

## 2019-11-17 DIAGNOSIS — R69 Illness, unspecified: Secondary | ICD-10-CM | POA: Diagnosis not present

## 2019-11-20 DIAGNOSIS — H18523 Epithelial (juvenile) corneal dystrophy, bilateral: Secondary | ICD-10-CM | POA: Diagnosis not present

## 2019-11-26 DIAGNOSIS — L03031 Cellulitis of right toe: Secondary | ICD-10-CM | POA: Diagnosis not present

## 2020-01-01 DIAGNOSIS — M545 Low back pain: Secondary | ICD-10-CM | POA: Diagnosis not present

## 2020-01-01 DIAGNOSIS — M4316 Spondylolisthesis, lumbar region: Secondary | ICD-10-CM | POA: Diagnosis not present

## 2020-02-02 DIAGNOSIS — I77819 Aortic ectasia, unspecified site: Secondary | ICD-10-CM | POA: Diagnosis not present

## 2020-02-02 DIAGNOSIS — N819 Female genital prolapse, unspecified: Secondary | ICD-10-CM | POA: Diagnosis not present

## 2020-02-02 DIAGNOSIS — M8580 Other specified disorders of bone density and structure, unspecified site: Secondary | ICD-10-CM | POA: Diagnosis not present

## 2020-02-02 DIAGNOSIS — I6529 Occlusion and stenosis of unspecified carotid artery: Secondary | ICD-10-CM | POA: Diagnosis not present

## 2020-02-02 DIAGNOSIS — Z6831 Body mass index (BMI) 31.0-31.9, adult: Secondary | ICD-10-CM | POA: Diagnosis not present

## 2020-02-02 DIAGNOSIS — M2550 Pain in unspecified joint: Secondary | ICD-10-CM | POA: Diagnosis not present

## 2020-02-02 DIAGNOSIS — G3184 Mild cognitive impairment, so stated: Secondary | ICD-10-CM | POA: Diagnosis not present

## 2020-02-02 DIAGNOSIS — Z9181 History of falling: Secondary | ICD-10-CM | POA: Diagnosis not present

## 2020-02-02 DIAGNOSIS — E785 Hyperlipidemia, unspecified: Secondary | ICD-10-CM | POA: Diagnosis not present

## 2020-02-02 DIAGNOSIS — E039 Hypothyroidism, unspecified: Secondary | ICD-10-CM | POA: Diagnosis not present

## 2020-02-08 DIAGNOSIS — R69 Illness, unspecified: Secondary | ICD-10-CM | POA: Diagnosis not present

## 2020-02-09 DIAGNOSIS — M47816 Spondylosis without myelopathy or radiculopathy, lumbar region: Secondary | ICD-10-CM | POA: Diagnosis not present

## 2020-02-09 DIAGNOSIS — M961 Postlaminectomy syndrome, not elsewhere classified: Secondary | ICD-10-CM | POA: Diagnosis not present

## 2020-02-09 DIAGNOSIS — G894 Chronic pain syndrome: Secondary | ICD-10-CM | POA: Diagnosis not present

## 2020-02-09 DIAGNOSIS — M5136 Other intervertebral disc degeneration, lumbar region: Secondary | ICD-10-CM | POA: Diagnosis not present

## 2020-02-09 DIAGNOSIS — M5416 Radiculopathy, lumbar region: Secondary | ICD-10-CM | POA: Diagnosis not present

## 2020-02-09 DIAGNOSIS — Z1389 Encounter for screening for other disorder: Secondary | ICD-10-CM | POA: Diagnosis not present

## 2020-02-17 DIAGNOSIS — R69 Illness, unspecified: Secondary | ICD-10-CM | POA: Diagnosis not present

## 2020-02-19 DIAGNOSIS — I8310 Varicose veins of unspecified lower extremity with inflammation: Secondary | ICD-10-CM | POA: Diagnosis not present

## 2020-02-19 DIAGNOSIS — L570 Actinic keratosis: Secondary | ICD-10-CM | POA: Diagnosis not present

## 2020-02-19 DIAGNOSIS — L853 Xerosis cutis: Secondary | ICD-10-CM | POA: Diagnosis not present

## 2020-02-26 DIAGNOSIS — R69 Illness, unspecified: Secondary | ICD-10-CM | POA: Diagnosis not present

## 2020-06-02 DIAGNOSIS — M961 Postlaminectomy syndrome, not elsewhere classified: Secondary | ICD-10-CM | POA: Diagnosis not present

## 2020-06-03 DIAGNOSIS — M47816 Spondylosis without myelopathy or radiculopathy, lumbar region: Secondary | ICD-10-CM | POA: Diagnosis not present

## 2020-06-03 DIAGNOSIS — E669 Obesity, unspecified: Secondary | ICD-10-CM | POA: Diagnosis not present

## 2020-06-03 DIAGNOSIS — M961 Postlaminectomy syndrome, not elsewhere classified: Secondary | ICD-10-CM | POA: Diagnosis not present

## 2020-06-03 DIAGNOSIS — M5136 Other intervertebral disc degeneration, lumbar region: Secondary | ICD-10-CM | POA: Diagnosis not present

## 2020-06-03 DIAGNOSIS — M5416 Radiculopathy, lumbar region: Secondary | ICD-10-CM | POA: Diagnosis not present

## 2020-07-01 DIAGNOSIS — M47816 Spondylosis without myelopathy or radiculopathy, lumbar region: Secondary | ICD-10-CM | POA: Diagnosis not present

## 2020-07-01 DIAGNOSIS — M961 Postlaminectomy syndrome, not elsewhere classified: Secondary | ICD-10-CM | POA: Diagnosis not present

## 2020-07-01 DIAGNOSIS — E669 Obesity, unspecified: Secondary | ICD-10-CM | POA: Diagnosis not present

## 2020-07-01 DIAGNOSIS — M5416 Radiculopathy, lumbar region: Secondary | ICD-10-CM | POA: Diagnosis not present

## 2020-07-01 DIAGNOSIS — M5136 Other intervertebral disc degeneration, lumbar region: Secondary | ICD-10-CM | POA: Diagnosis not present

## 2020-07-22 DIAGNOSIS — M19041 Primary osteoarthritis, right hand: Secondary | ICD-10-CM | POA: Diagnosis not present

## 2020-07-22 DIAGNOSIS — M79641 Pain in right hand: Secondary | ICD-10-CM | POA: Diagnosis not present

## 2020-07-22 DIAGNOSIS — M1811 Unilateral primary osteoarthritis of first carpometacarpal joint, right hand: Secondary | ICD-10-CM | POA: Diagnosis not present

## 2020-07-29 DIAGNOSIS — G894 Chronic pain syndrome: Secondary | ICD-10-CM | POA: Diagnosis not present

## 2020-07-29 DIAGNOSIS — M47816 Spondylosis without myelopathy or radiculopathy, lumbar region: Secondary | ICD-10-CM | POA: Diagnosis not present

## 2020-07-29 DIAGNOSIS — E669 Obesity, unspecified: Secondary | ICD-10-CM | POA: Diagnosis not present

## 2020-07-29 DIAGNOSIS — M5136 Other intervertebral disc degeneration, lumbar region: Secondary | ICD-10-CM | POA: Diagnosis not present

## 2020-07-29 DIAGNOSIS — M961 Postlaminectomy syndrome, not elsewhere classified: Secondary | ICD-10-CM | POA: Diagnosis not present

## 2020-07-29 DIAGNOSIS — M5416 Radiculopathy, lumbar region: Secondary | ICD-10-CM | POA: Diagnosis not present

## 2020-08-01 ENCOUNTER — Ambulatory Visit (HOSPITAL_COMMUNITY)
Admission: RE | Admit: 2020-08-01 | Discharge: 2020-08-01 | Disposition: A | Discharge status: Home / Self Care | Payer: Medicare HMO | Source: Ambulatory Visit | Attending: Vascular Surgery | Admitting: Vascular Surgery

## 2020-08-01 ENCOUNTER — Ambulatory Visit (INDEPENDENT_AMBULATORY_CARE_PROVIDER_SITE_OTHER)
Admission: RE | Admit: 2020-08-01 | Discharge: 2020-08-01 | Disposition: A | Discharge status: Home / Self Care | Payer: Medicare HMO | Source: Ambulatory Visit | Attending: Vascular Surgery | Admitting: Vascular Surgery

## 2020-08-01 ENCOUNTER — Other Ambulatory Visit: Payer: Self-pay

## 2020-08-01 ENCOUNTER — Ambulatory Visit (INDEPENDENT_AMBULATORY_CARE_PROVIDER_SITE_OTHER): Payer: Medicare HMO | Admitting: Physician Assistant

## 2020-08-01 VITALS — BP 131/70 | HR 67 | Temp 98.0°F | Resp 20 | Ht 62.5 in | Wt 170.7 lb

## 2020-08-01 DIAGNOSIS — I6523 Occlusion and stenosis of bilateral carotid arteries: Secondary | ICD-10-CM

## 2020-08-01 DIAGNOSIS — I77811 Abdominal aortic ectasia: Secondary | ICD-10-CM | POA: Insufficient documentation

## 2020-08-01 NOTE — Progress Notes (Signed)
HISTORY AND PHYSICAL     CC:  follow up. Requesting Provider:  Philmore Pali, NP  HPI: This is a 80 y.o. female who is here today for follow up and has hx of left carotid endarterectomy in January 2015 and right carotid endarterectomy in January 2014 at [facility].  Both surgeries were performed for asymptomatic carotid stenosis.   She also has hx of aortic ectasia.  Pt was last seen on 07/30/2019.  At that time, she was not having any sx of stroke.  She also denied any back or abdominal pain, claudication, rest pain.  Her carotid duplex showed 40-59% B ICA stenosis.  Aorta measured 3.4cm.    The pt returns today for follow up.  She states that she is doing well.  When asked if she had any temporary blindness, she stated that she had been having trouble with the right eye.  She states that she can be reading or watching tv and her right eye all of a sudden gets blurry and then clears in a couple of minutes.  She states this has been happening about once a month for the past 5-6 months.  She denies any unilateral weakness or numbness or paralysis of any limb.  She has not had any facial droop.  She states that she lives by herself and when she is not around people, she finds it difficult to find her words, but this improves when she is around people.  She states her daughter was worried and she was evaluated for Alzheimers and this was negative.  She has chronic back pain.  But denies any new abdominal or back pain.  She states that she has stopped taking her asa.  Her PCP said that she should continue her statin but okay to stop asa.    The pt is on a statin for cholesterol management.    The pt is not on an aspirin.    Other AC:  none The pt is not on medication for hypertension.  The pt does not have diabetes. Tobacco hx:  Former-quit 2000   Past Medical History:  Diagnosis Date  . Family history of bladder cancer   . Family history of colon cancer   . Family history of genetic  mutation for hereditary nonpolyposis colorectal cancer (HNPCC)   . Family history of kidney cancer     No past surgical history on file.  Allergies  Allergen Reactions  . Codeine     Nausea   . Penicillins     Difficulty swallowing, difficulty breathing, hives    Current Outpatient Medications  Medication Sig Dispense Refill  . celecoxib (CELEBREX) 200 MG capsule Take 200 mg by mouth 2 (two) times daily.    Marland Kitchen levothyroxine (SYNTHROID, LEVOTHROID) 25 MCG tablet Take 25 mcg by mouth daily before breakfast.    . Nutritional Supplements (QUINOA/KALE/HEMP) LIQD Take by mouth.    . simvastatin (ZOCOR) 40 MG tablet Take 40 mg by mouth daily.    Marland Kitchen triamcinolone cream (KENALOG) 0.5 % Apply 1 application topically 2 (two) times daily.     No current facility-administered medications for this visit.    Family History  Problem Relation Age of Onset  . Colon cancer Mother 23       d. 62  . Tuberculosis Father   . Bladder Cancer Sister 62       bilateral urothelial  . Bladder Cancer Brother 42       urothelial  . Diabetes Maternal Aunt   .  Bladder Cancer Brother        urothelial  . Kidney cancer Brother   . Bladder Cancer Other        urothelial, d. 93 - niece  . Schizophrenia Other   . Colon cancer Other        dx twice, under 66 and over 20; nephew  . Testicular cancer Other 24       grand-nephew    Social History   Socioeconomic History  . Marital status: Unknown    Spouse name: Not on file  . Number of children: Not on file  . Years of education: Not on file  . Highest education level: Not on file  Occupational History  . Not on file  Tobacco Use  . Smoking status: Former Smoker    Quit date: 05/28/1998    Years since quitting: 22.1  . Smokeless tobacco: Never Used  Vaping Use  . Vaping Use: Not on file  Substance and Sexual Activity  . Alcohol use: Never  . Drug use: Not on file  . Sexual activity: Not on file  Other Topics Concern  . Not on file  Social  History Narrative  . Not on file   Social Determinants of Health   Financial Resource Strain: Not on file  Food Insecurity: Not on file  Transportation Needs: Not on file  Physical Activity: Not on file  Stress: Not on file  Social Connections: Not on file  Intimate Partner Violence: Not on file     REVIEW OF SYSTEMS:   [X]  denotes positive finding, [ ]  denotes negative finding Cardiac  Comments:  Chest pain or chest pressure:    Shortness of breath upon exertion:    Short of breath when lying flat:    Irregular heart rhythm:        Vascular    Pain in calf, thigh, or hip brought on by ambulation:    Pain in feet at night that wakes you up from your sleep:     Blood clot in your veins:    Leg swelling:         Pulmonary    Oxygen at home:    Wheezing:         Neurologic    Sudden weakness in arms or legs:     Sudden numbness in arms or legs:     Sudden onset of difficulty speaking or understanding others    Temporary loss of vision in one eye:     Problems with dizziness:         Gastrointestinal    Blood in stool:     Vomited blood:         Genitourinary    Burning when urinating:     Blood in urine:        Psychiatric    Major depression:         Hematologic    Bleeding problems:    Problems with blood clotting too easily:        Skin    Rashes or ulcers:        Constitutional    Fever or chills:      PHYSICAL EXAMINATION:  Today's Vitals   08/01/20 0935 08/01/20 0938  BP: 134/67 131/70  Pulse: 67   Resp: 20   Temp: 98 F (36.7 C)   TempSrc: Temporal   SpO2: 100%   Weight: 170 lb 11.2 oz (77.4 kg)   Height: 5' 2.5" (1.588 m)  Body mass index is 30.72 kg/m.   General:  WDWN in NAD; vital signs documented above Gait: Not observed HENT: WNL, normocephalic Pulmonary: normal non-labored breathing  Cardiac: regular HR;  with carotid bruit-right Abdomen: soft, NT, aortic pulse is not palpable Skin: without rashes Vascular  Exam/Pulses:  Right Left  Radial 2+ (normal) 2+ (normal)  Popliteal Unable to palpate Unable to palpate  DP Unable to palpate Unable to palpate  PT Unable to palpate Unable to palpate   Extremities: without ischemic changes, without Gangrene , without cellulitis; without open wounds;  Musculoskeletal: no muscle wasting or atrophy  Neurologic: A&O X 3;  speech is fluent/normal; moving all extremities equally; face is symmetrical.  Psychiatric:  The pt has Normal affect.   Non-Invasive Vascular Imaging:   Aortic duplex on 08/01/2020: Abdominal Aorta: There is evidence of abnormal dilatation of the distal  Abdominal aorta. Ectatic aorta with largest measurement 3.26 cm on today's exam. Relatively unchanged from previous exam.  Non-Invasive Vascular Imaging:   Carotid Duplex on 08/01/2020: Right:  60-79% ICA stenosis Left:  40-59% ICA stenosis  Previous Aortic duplex on 07/30/2019: Summary:  Abdominal Aorta: There is evidence of abnormal dilatation of the mid  Abdominal aorta. The largest aortic measurement is 3.4 cm.  Previous Carotid duplex on 07/30/2019: Right: 40-59% ICA stenosis Left:   40-59% ICA stenosis   ASSESSMENT/PLAN:: 80 y.o. 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    Ectatic aorta -today's duplex reveals measurement of 3.3cm, slightly less than last visit but is relatively unchanged from previous exam.   -pt will f/u in one year with AAA duplex.   Carotid stenosis -duplex today reveals an increase to the 60-79% stenosis category on the right.  She was high end of the 40-59% at last visit and now low end of the 60-79% category today.  Pt has had some temporary visual changes in the right eye over the past 6 months that last a couple minutes at a time and then improves.   She did jump to the 60-79% category on the right with this u/s.  Discussed with Dr. Donzetta Matters and will get CTA head/neck and have  her return to discuss results with Dr. Donzetta Matters.   -discussed s/s of stroke/TIA and if pt develops any sx, they know to call 911 or go to the emergency room -pt will f/u in 6 months with repeat carotid duplex -pt was told by PCP it was okay to stop asa-recommend taking baby asa daily as well as statin daily given hx of bilateral CEA.    Leontine Locket, [facility] [phone]  Clinic MD:   Donzetta Matters

## 2020-08-04 DIAGNOSIS — G3184 Mild cognitive impairment, so stated: Secondary | ICD-10-CM | POA: Diagnosis not present

## 2020-08-04 DIAGNOSIS — M2550 Pain in unspecified joint: Secondary | ICD-10-CM | POA: Diagnosis not present

## 2020-08-04 DIAGNOSIS — M8580 Other specified disorders of bone density and structure, unspecified site: Secondary | ICD-10-CM | POA: Diagnosis not present

## 2020-08-04 DIAGNOSIS — N819 Female genital prolapse, unspecified: Secondary | ICD-10-CM | POA: Diagnosis not present

## 2020-08-04 DIAGNOSIS — E039 Hypothyroidism, unspecified: Secondary | ICD-10-CM | POA: Diagnosis not present

## 2020-08-04 DIAGNOSIS — Z1331 Encounter for screening for depression: Secondary | ICD-10-CM | POA: Diagnosis not present

## 2020-08-04 DIAGNOSIS — E785 Hyperlipidemia, unspecified: Secondary | ICD-10-CM | POA: Diagnosis not present

## 2020-08-04 DIAGNOSIS — I6529 Occlusion and stenosis of unspecified carotid artery: Secondary | ICD-10-CM | POA: Diagnosis not present

## 2020-08-04 DIAGNOSIS — Z9181 History of falling: Secondary | ICD-10-CM | POA: Diagnosis not present

## 2020-08-04 DIAGNOSIS — I77819 Aortic ectasia, unspecified site: Secondary | ICD-10-CM | POA: Diagnosis not present

## 2020-08-10 ENCOUNTER — Ambulatory Visit
Admission: RE | Admit: 2020-08-10 | Discharge: 2020-08-10 | Disposition: A | Discharge status: Home / Self Care | Payer: Medicare HMO | Source: Ambulatory Visit | Attending: Vascular Surgery | Admitting: Vascular Surgery

## 2020-08-10 ENCOUNTER — Other Ambulatory Visit: Payer: Self-pay

## 2020-08-10 DIAGNOSIS — I6523 Occlusion and stenosis of bilateral carotid arteries: Secondary | ICD-10-CM | POA: Diagnosis not present

## 2020-08-10 DIAGNOSIS — M2669 Other specified disorders of temporomandibular joint: Secondary | ICD-10-CM | POA: Diagnosis not present

## 2020-08-10 DIAGNOSIS — I6782 Cerebral ischemia: Secondary | ICD-10-CM | POA: Diagnosis not present

## 2020-08-10 DIAGNOSIS — Z0389 Encounter for observation for other suspected diseases and conditions ruled out: Secondary | ICD-10-CM | POA: Diagnosis not present

## 2020-08-10 MED ORDER — IOPAMIDOL (ISOVUE-370) INJECTION 76%
75.0000 mL | Freq: Once | INTRAVENOUS | Status: AC | PRN
  Administered 2020-08-10: 75 mL via INTRAVENOUS

## 2020-08-17 ENCOUNTER — Telehealth: Payer: Self-pay

## 2020-08-17 NOTE — Telephone Encounter (Signed)
Patient called to ask for CT scan results from nurse. I advised her that if anything was immediately life threatening - we would have contacted her. Advised her I could not go into further detail and she would have to discuss scan with provider. Says she has called multiple times to get results and cancel appt with MD. Explained that while nothing was immediately life threatening, I could not go into any further detail and [facility] would be happy to discuss results/plan with her at scheduled appt on Friday. She would have liked a call back yesterday from RN about results, and is cancelling Friday appt.

## 2020-08-19 ENCOUNTER — Ambulatory Visit: Payer: Medicare HMO | Admitting: Vascular Surgery

## 2020-08-26 DIAGNOSIS — M5416 Radiculopathy, lumbar region: Secondary | ICD-10-CM | POA: Diagnosis not present

## 2020-08-26 DIAGNOSIS — M961 Postlaminectomy syndrome, not elsewhere classified: Secondary | ICD-10-CM | POA: Diagnosis not present

## 2020-08-26 DIAGNOSIS — E669 Obesity, unspecified: Secondary | ICD-10-CM | POA: Diagnosis not present

## 2020-08-26 DIAGNOSIS — M47816 Spondylosis without myelopathy or radiculopathy, lumbar region: Secondary | ICD-10-CM | POA: Diagnosis not present

## 2020-08-26 DIAGNOSIS — M5136 Other intervertebral disc degeneration, lumbar region: Secondary | ICD-10-CM | POA: Diagnosis not present

## 2020-09-23 DIAGNOSIS — M961 Postlaminectomy syndrome, not elsewhere classified: Secondary | ICD-10-CM | POA: Diagnosis not present

## 2020-09-23 DIAGNOSIS — M47816 Spondylosis without myelopathy or radiculopathy, lumbar region: Secondary | ICD-10-CM | POA: Diagnosis not present

## 2020-09-23 DIAGNOSIS — E669 Obesity, unspecified: Secondary | ICD-10-CM | POA: Diagnosis not present

## 2020-09-23 DIAGNOSIS — M5416 Radiculopathy, lumbar region: Secondary | ICD-10-CM | POA: Diagnosis not present

## 2020-09-23 DIAGNOSIS — M5136 Other intervertebral disc degeneration, lumbar region: Secondary | ICD-10-CM | POA: Diagnosis not present

## 2020-10-20 DIAGNOSIS — M5416 Radiculopathy, lumbar region: Secondary | ICD-10-CM | POA: Diagnosis not present

## 2020-10-20 DIAGNOSIS — M5136 Other intervertebral disc degeneration, lumbar region: Secondary | ICD-10-CM | POA: Diagnosis not present

## 2020-10-20 DIAGNOSIS — M961 Postlaminectomy syndrome, not elsewhere classified: Secondary | ICD-10-CM | POA: Diagnosis not present

## 2020-10-20 DIAGNOSIS — M47816 Spondylosis without myelopathy or radiculopathy, lumbar region: Secondary | ICD-10-CM | POA: Diagnosis not present

## 2020-10-20 DIAGNOSIS — E669 Obesity, unspecified: Secondary | ICD-10-CM | POA: Diagnosis not present

## 2020-11-10 DIAGNOSIS — E785 Hyperlipidemia, unspecified: Secondary | ICD-10-CM | POA: Diagnosis not present

## 2020-11-10 DIAGNOSIS — Z1331 Encounter for screening for depression: Secondary | ICD-10-CM | POA: Diagnosis not present

## 2020-11-10 DIAGNOSIS — Z0000 Encounter for general adult medical examination without abnormal findings: Secondary | ICD-10-CM | POA: Diagnosis not present

## 2020-11-10 DIAGNOSIS — Z139 Encounter for screening, unspecified: Secondary | ICD-10-CM | POA: Diagnosis not present

## 2020-11-10 DIAGNOSIS — Z9181 History of falling: Secondary | ICD-10-CM | POA: Diagnosis not present

## 2020-11-23 DIAGNOSIS — H18523 Epithelial (juvenile) corneal dystrophy, bilateral: Secondary | ICD-10-CM | POA: Diagnosis not present

## 2020-11-23 DIAGNOSIS — H524 Presbyopia: Secondary | ICD-10-CM | POA: Diagnosis not present

## 2020-11-24 DIAGNOSIS — G894 Chronic pain syndrome: Secondary | ICD-10-CM | POA: Diagnosis not present

## 2020-11-24 DIAGNOSIS — E669 Obesity, unspecified: Secondary | ICD-10-CM | POA: Diagnosis not present

## 2020-11-24 DIAGNOSIS — M961 Postlaminectomy syndrome, not elsewhere classified: Secondary | ICD-10-CM | POA: Diagnosis not present

## 2020-11-24 DIAGNOSIS — M5416 Radiculopathy, lumbar region: Secondary | ICD-10-CM | POA: Diagnosis not present

## 2020-11-24 DIAGNOSIS — M47816 Spondylosis without myelopathy or radiculopathy, lumbar region: Secondary | ICD-10-CM | POA: Diagnosis not present

## 2020-11-24 DIAGNOSIS — M5136 Other intervertebral disc degeneration, lumbar region: Secondary | ICD-10-CM | POA: Diagnosis not present

## 2020-12-22 DIAGNOSIS — M5416 Radiculopathy, lumbar region: Secondary | ICD-10-CM | POA: Diagnosis not present

## 2020-12-22 DIAGNOSIS — M961 Postlaminectomy syndrome, not elsewhere classified: Secondary | ICD-10-CM | POA: Diagnosis not present

## 2020-12-22 DIAGNOSIS — E669 Obesity, unspecified: Secondary | ICD-10-CM | POA: Diagnosis not present

## 2020-12-22 DIAGNOSIS — Z1389 Encounter for screening for other disorder: Secondary | ICD-10-CM | POA: Diagnosis not present

## 2020-12-22 DIAGNOSIS — M5136 Other intervertebral disc degeneration, lumbar region: Secondary | ICD-10-CM | POA: Diagnosis not present

## 2020-12-22 DIAGNOSIS — M47816 Spondylosis without myelopathy or radiculopathy, lumbar region: Secondary | ICD-10-CM | POA: Diagnosis not present

## 2021-01-19 DIAGNOSIS — M961 Postlaminectomy syndrome, not elsewhere classified: Secondary | ICD-10-CM | POA: Diagnosis not present

## 2021-01-19 DIAGNOSIS — E669 Obesity, unspecified: Secondary | ICD-10-CM | POA: Diagnosis not present

## 2021-01-19 DIAGNOSIS — Z1389 Encounter for screening for other disorder: Secondary | ICD-10-CM | POA: Diagnosis not present

## 2021-01-19 DIAGNOSIS — M47816 Spondylosis without myelopathy or radiculopathy, lumbar region: Secondary | ICD-10-CM | POA: Diagnosis not present

## 2021-01-19 DIAGNOSIS — M5416 Radiculopathy, lumbar region: Secondary | ICD-10-CM | POA: Diagnosis not present

## 2021-01-19 DIAGNOSIS — M5136 Other intervertebral disc degeneration, lumbar region: Secondary | ICD-10-CM | POA: Diagnosis not present

## 2021-02-03 DIAGNOSIS — I6529 Occlusion and stenosis of unspecified carotid artery: Secondary | ICD-10-CM | POA: Diagnosis not present

## 2021-02-03 DIAGNOSIS — I77819 Aortic ectasia, unspecified site: Secondary | ICD-10-CM | POA: Diagnosis not present

## 2021-02-03 DIAGNOSIS — M8580 Other specified disorders of bone density and structure, unspecified site: Secondary | ICD-10-CM | POA: Diagnosis not present

## 2021-02-03 DIAGNOSIS — E785 Hyperlipidemia, unspecified: Secondary | ICD-10-CM | POA: Diagnosis not present

## 2021-02-03 DIAGNOSIS — Z23 Encounter for immunization: Secondary | ICD-10-CM | POA: Diagnosis not present

## 2021-02-03 DIAGNOSIS — E039 Hypothyroidism, unspecified: Secondary | ICD-10-CM | POA: Diagnosis not present

## 2021-02-03 DIAGNOSIS — N819 Female genital prolapse, unspecified: Secondary | ICD-10-CM | POA: Diagnosis not present

## 2021-02-03 DIAGNOSIS — Z6830 Body mass index (BMI) 30.0-30.9, adult: Secondary | ICD-10-CM | POA: Diagnosis not present

## 2021-02-03 DIAGNOSIS — G3184 Mild cognitive impairment, so stated: Secondary | ICD-10-CM | POA: Diagnosis not present

## 2021-02-03 DIAGNOSIS — M2550 Pain in unspecified joint: Secondary | ICD-10-CM | POA: Diagnosis not present

## 2021-02-16 DIAGNOSIS — M5136 Other intervertebral disc degeneration, lumbar region: Secondary | ICD-10-CM | POA: Diagnosis not present

## 2021-02-16 DIAGNOSIS — G894 Chronic pain syndrome: Secondary | ICD-10-CM | POA: Diagnosis not present

## 2021-02-16 DIAGNOSIS — M47816 Spondylosis without myelopathy or radiculopathy, lumbar region: Secondary | ICD-10-CM | POA: Diagnosis not present

## 2021-02-16 DIAGNOSIS — M961 Postlaminectomy syndrome, not elsewhere classified: Secondary | ICD-10-CM | POA: Diagnosis not present

## 2021-02-16 DIAGNOSIS — M5416 Radiculopathy, lumbar region: Secondary | ICD-10-CM | POA: Diagnosis not present

## 2021-02-16 DIAGNOSIS — R69 Illness, unspecified: Secondary | ICD-10-CM | POA: Diagnosis not present

## 2021-02-16 DIAGNOSIS — F19982 Other psychoactive substance use, unspecified with psychoactive substance-induced sleep disorder: Secondary | ICD-10-CM | POA: Diagnosis not present

## 2021-03-15 DIAGNOSIS — M47816 Spondylosis without myelopathy or radiculopathy, lumbar region: Secondary | ICD-10-CM | POA: Diagnosis not present

## 2021-03-15 DIAGNOSIS — M961 Postlaminectomy syndrome, not elsewhere classified: Secondary | ICD-10-CM | POA: Diagnosis not present

## 2021-03-15 DIAGNOSIS — E669 Obesity, unspecified: Secondary | ICD-10-CM | POA: Diagnosis not present

## 2021-03-15 DIAGNOSIS — M5136 Other intervertebral disc degeneration, lumbar region: Secondary | ICD-10-CM | POA: Diagnosis not present

## 2021-03-15 DIAGNOSIS — Z1389 Encounter for screening for other disorder: Secondary | ICD-10-CM | POA: Diagnosis not present

## 2021-03-15 DIAGNOSIS — M5416 Radiculopathy, lumbar region: Secondary | ICD-10-CM | POA: Diagnosis not present

## 2021-03-20 DIAGNOSIS — M1811 Unilateral primary osteoarthritis of first carpometacarpal joint, right hand: Secondary | ICD-10-CM | POA: Diagnosis not present

## 2021-03-20 DIAGNOSIS — M79641 Pain in right hand: Secondary | ICD-10-CM | POA: Diagnosis not present

## 2021-03-20 DIAGNOSIS — M19041 Primary osteoarthritis, right hand: Secondary | ICD-10-CM | POA: Diagnosis not present

## 2021-04-11 DIAGNOSIS — E669 Obesity, unspecified: Secondary | ICD-10-CM | POA: Diagnosis not present

## 2021-04-11 DIAGNOSIS — M961 Postlaminectomy syndrome, not elsewhere classified: Secondary | ICD-10-CM | POA: Diagnosis not present

## 2021-04-11 DIAGNOSIS — M5136 Other intervertebral disc degeneration, lumbar region: Secondary | ICD-10-CM | POA: Diagnosis not present

## 2021-04-11 DIAGNOSIS — M47816 Spondylosis without myelopathy or radiculopathy, lumbar region: Secondary | ICD-10-CM | POA: Diagnosis not present

## 2021-04-11 DIAGNOSIS — Z1389 Encounter for screening for other disorder: Secondary | ICD-10-CM | POA: Diagnosis not present

## 2021-04-11 DIAGNOSIS — Z79891 Long term (current) use of opiate analgesic: Secondary | ICD-10-CM | POA: Diagnosis not present

## 2021-04-11 DIAGNOSIS — M5416 Radiculopathy, lumbar region: Secondary | ICD-10-CM | POA: Diagnosis not present

## 2021-05-08 DIAGNOSIS — E669 Obesity, unspecified: Secondary | ICD-10-CM | POA: Diagnosis not present

## 2021-05-08 DIAGNOSIS — Z79891 Long term (current) use of opiate analgesic: Secondary | ICD-10-CM | POA: Diagnosis not present

## 2021-05-08 DIAGNOSIS — M47816 Spondylosis without myelopathy or radiculopathy, lumbar region: Secondary | ICD-10-CM | POA: Diagnosis not present

## 2021-05-08 DIAGNOSIS — M5416 Radiculopathy, lumbar region: Secondary | ICD-10-CM | POA: Diagnosis not present

## 2021-05-08 DIAGNOSIS — M5136 Other intervertebral disc degeneration, lumbar region: Secondary | ICD-10-CM | POA: Diagnosis not present

## 2021-05-08 DIAGNOSIS — M961 Postlaminectomy syndrome, not elsewhere classified: Secondary | ICD-10-CM | POA: Diagnosis not present

## 2021-05-08 DIAGNOSIS — Z1389 Encounter for screening for other disorder: Secondary | ICD-10-CM | POA: Diagnosis not present

## 2021-06-06 ENCOUNTER — Telehealth: Payer: Self-pay | Admitting: *Deleted

## 2021-06-06 NOTE — Telephone Encounter (Signed)
Patient called stating that she was experiencing shading over right eye as she had six months ago. An appointment was scheduled for 06/14/2021 for bilateral carotid duplex and follow up appointment. I advised patient if symptoms worsened or additional symptoms she should call 911. Patient voiced understanding of the instructions.

## 2021-06-08 ENCOUNTER — Other Ambulatory Visit: Payer: Self-pay

## 2021-06-08 DIAGNOSIS — I6523 Occlusion and stenosis of bilateral carotid arteries: Secondary | ICD-10-CM

## 2021-06-12 DIAGNOSIS — E669 Obesity, unspecified: Secondary | ICD-10-CM | POA: Diagnosis not present

## 2021-06-12 DIAGNOSIS — M47816 Spondylosis without myelopathy or radiculopathy, lumbar region: Secondary | ICD-10-CM | POA: Diagnosis not present

## 2021-06-12 DIAGNOSIS — M5416 Radiculopathy, lumbar region: Secondary | ICD-10-CM | POA: Diagnosis not present

## 2021-06-12 DIAGNOSIS — Z79891 Long term (current) use of opiate analgesic: Secondary | ICD-10-CM | POA: Diagnosis not present

## 2021-06-12 DIAGNOSIS — M961 Postlaminectomy syndrome, not elsewhere classified: Secondary | ICD-10-CM | POA: Diagnosis not present

## 2021-06-12 DIAGNOSIS — Z1389 Encounter for screening for other disorder: Secondary | ICD-10-CM | POA: Diagnosis not present

## 2021-06-12 DIAGNOSIS — M5136 Other intervertebral disc degeneration, lumbar region: Secondary | ICD-10-CM | POA: Diagnosis not present

## 2021-06-12 DIAGNOSIS — G894 Chronic pain syndrome: Secondary | ICD-10-CM | POA: Diagnosis not present

## 2021-06-13 ENCOUNTER — Other Ambulatory Visit: Payer: Self-pay

## 2021-06-13 DIAGNOSIS — I77811 Abdominal aortic ectasia: Secondary | ICD-10-CM

## 2021-06-14 ENCOUNTER — Ambulatory Visit: Payer: Medicare HMO | Admitting: Physician Assistant

## 2021-06-14 ENCOUNTER — Other Ambulatory Visit: Payer: Self-pay

## 2021-06-14 ENCOUNTER — Ambulatory Visit (INDEPENDENT_AMBULATORY_CARE_PROVIDER_SITE_OTHER)
Admission: RE | Admit: 2021-06-14 | Discharge: 2021-06-14 | Disposition: A | Discharge status: Home / Self Care | Payer: Medicare HMO | Source: Ambulatory Visit | Attending: Physician Assistant | Admitting: Physician Assistant

## 2021-06-14 ENCOUNTER — Ambulatory Visit (HOSPITAL_COMMUNITY)
Admission: RE | Admit: 2021-06-14 | Discharge: 2021-06-14 | Disposition: A | Discharge status: Home / Self Care | Payer: Medicare HMO | Source: Ambulatory Visit | Attending: Vascular Surgery | Admitting: Vascular Surgery

## 2021-06-14 VITALS — BP 157/69 | HR 62 | Temp 97.8°F | Resp 20 | Ht 62.5 in | Wt 182.3 lb

## 2021-06-14 DIAGNOSIS — I77811 Abdominal aortic ectasia: Secondary | ICD-10-CM | POA: Diagnosis not present

## 2021-06-14 DIAGNOSIS — I6523 Occlusion and stenosis of bilateral carotid arteries: Secondary | ICD-10-CM

## 2021-06-14 NOTE — Progress Notes (Signed)
Office Note     CC:  follow up Requesting Provider:  Philmore Pali, NP  HPI: Kaitlyn Martin is a 81 y.o. (02-14-1941) female who presents to go over vascular studies related to carotid artery stenosis as well as AAA.  Surgical history is significant for left carotid endarterectomy in 2015 and right carotid endarterectomy in 2014 at [facility].  Both surgeries were performed due to asymptomatic high-grade stenosis.  Patient states she had an episode of blurry vision in her right eye several months ago however this resolved and she has not had any recurrent symptoms.  She had a CTA head and neck performed in March 2022 which demonstrated widely patent endarterectomy sites.  She denies any strokelike symptoms including slurring speech or one-sided weakness.  She is taking a baby aspirin every morning.  She takes a daily statin as well.  She is also followed for aortic ectasia with largest diameter in the distal abdominal aorta of 3.2 cm.  She denies any new or changing abdominal or back pain.  She does have chronic back pain and has had 3 surgeries which she feels is the cause of her discomfort.   Past Medical History:  Diagnosis Date   Family history of bladder cancer    Family history of colon cancer    Family history of genetic mutation for hereditary nonpolyposis colorectal cancer (HNPCC)    Family history of kidney cancer     History reviewed. No pertinent surgical history.  Social History   Socioeconomic History   Marital status: Unknown    Spouse name: Not on file   Number of children: Not on file   Years of education: Not on file   Highest education level: Not on file  Occupational History   Not on file  Tobacco Use   Smoking status: Former    Types: Cigarettes    Quit date: 05/28/1998    Years since quitting: 23.0   Smokeless tobacco: Never  Vaping Use   Vaping Use: Not on file  Substance and Sexual Activity   Alcohol use: Never   Drug use: Not on file   Sexual  activity: Not on file  Other Topics Concern   Not on file  Social History Narrative   Not on file   Social Determinants of Health   Financial Resource Strain: Not on file  Food Insecurity: Not on file  Transportation Needs: Not on file  Physical Activity: Not on file  Stress: Not on file  Social Connections: Not on file  Intimate Partner Violence: Not on file    Family History  Problem Relation Age of Onset   Colon cancer Mother 30       d. 33   Tuberculosis Father    Bladder Cancer Sister 60       bilateral urothelial   Bladder Cancer Brother 23       urothelial   Diabetes Maternal Aunt    Bladder Cancer Brother        urothelial   Kidney cancer Brother    Bladder Cancer Other        urothelial, d. 62 - niece   Schizophrenia Other    Colon cancer Other        dx twice, under 1 and over 56; nephew   Testicular cancer Other 24       grand-nephew    Current Outpatient Medications  Medication Sig Dispense Refill   alendronate (FOSAMAX) 70 MG tablet Take 70 mg by  mouth once a week.     celecoxib (CELEBREX) 200 MG capsule Take 200 mg by mouth 2 (two) times daily.     levothyroxine (SYNTHROID, LEVOTHROID) 25 MCG tablet Take 25 mcg by mouth daily before breakfast.     simvastatin (ZOCOR) 40 MG tablet Take 40 mg by mouth daily.     traMADol (ULTRAM) 50 MG tablet      triamcinolone cream (KENALOG) 0.5 % Apply 1 application topically 2 (two) times daily.     No current facility-administered medications for this visit.    Allergies  Allergen Reactions   Codeine     Nausea    Penicillins     Difficulty swallowing, difficulty breathing, hives   Statins Other (See Comments)    Muscle pain/spasms Muscle pain/spasms    Other Rash    Quinolone antibiotics-short of breath, and rash     REVIEW OF SYSTEMS:   [X]  denotes positive finding, [ ]  denotes negative finding Cardiac  Comments:  Chest pain or chest pressure:    Shortness of breath upon exertion:    Short  of breath when lying flat:    Irregular heart rhythm:        Vascular    Pain in calf, thigh, or hip brought on by ambulation:    Pain in feet at night that wakes you up from your sleep:     Blood clot in your veins:    Leg swelling:         Pulmonary    Oxygen at home:    Productive cough:     Wheezing:         Neurologic    Sudden weakness in arms or legs:     Sudden numbness in arms or legs:     Sudden onset of difficulty speaking or slurred speech:    Temporary loss of vision in one eye:     Problems with dizziness:         Gastrointestinal    Blood in stool:     Vomited blood:         Genitourinary    Burning when urinating:     Blood in urine:        Psychiatric    Major depression:         Hematologic    Bleeding problems:    Problems with blood clotting too easily:        Skin    Rashes or ulcers:        Constitutional    Fever or chills:      PHYSICAL EXAMINATION:  Vitals:   06/14/21 1058 06/14/21 1102  BP: (!) 122/57 (!) 157/69  Pulse: 62   Resp: 20   Temp: 97.8 F (36.6 C)   TempSrc: Temporal   SpO2: 97%   Weight: 182 lb 4.8 oz (82.7 kg)   Height: 5' 2.5" (1.588 m)     General:  WDWN in NAD; vital signs documented above Gait: Not observed HENT: WNL, normocephalic Pulmonary: normal non-labored breathing , without Rales, rhonchi,  wheezing Cardiac: regular HR Abdomen: soft, NT, no masses Skin: without rashes Vascular Exam/Pulses:  Right Left  Radial 2+ (normal) 2+ (normal)   Extremities: without wounds BLE Musculoskeletal: no muscle wasting or atrophy  Neurologic: A&O X 3;  CN grossly intact Psychiatric:  The pt has Normal affect.   Non-Invasive Vascular Imaging:   3.2cm distal abd aorta  40-59% stenosis B ICA    ASSESSMENT/PLAN:: 81 y.o. female here  for surveillance of carotid artery stenosis and AAA  -Carotid duplex demonstrates 40 to 59% stenosis of bilateral ICA; CTA of the neck in March demonstrated widely patent  endarterectomy sites.  If she were to develop recurrent vision changes in her right eye, we can repeat CTA however she is asymptomatic currently.  We will recheck carotid duplex in 1 year -AAA surveillance shows 3.2 cm distal abdominal aorta.  We will recheck this in 2 years -Continue aspirin and statin daily -Patient knows to call/return to office sooner with any questions or concerns   Dagoberto Ligas, PA-C [facility] [phone]  Clinic MD:   Donzetta Matters

## 2021-06-19 DIAGNOSIS — M8588 Other specified disorders of bone density and structure, other site: Secondary | ICD-10-CM | POA: Diagnosis not present

## 2021-06-19 DIAGNOSIS — M47816 Spondylosis without myelopathy or radiculopathy, lumbar region: Secondary | ICD-10-CM | POA: Diagnosis not present

## 2021-06-19 DIAGNOSIS — M545 Low back pain, unspecified: Secondary | ICD-10-CM | POA: Diagnosis not present

## 2021-07-10 DIAGNOSIS — Z79891 Long term (current) use of opiate analgesic: Secondary | ICD-10-CM | POA: Diagnosis not present

## 2021-07-10 DIAGNOSIS — Z1389 Encounter for screening for other disorder: Secondary | ICD-10-CM | POA: Diagnosis not present

## 2021-07-10 DIAGNOSIS — M961 Postlaminectomy syndrome, not elsewhere classified: Secondary | ICD-10-CM | POA: Diagnosis not present

## 2021-07-10 DIAGNOSIS — M5136 Other intervertebral disc degeneration, lumbar region: Secondary | ICD-10-CM | POA: Diagnosis not present

## 2021-07-10 DIAGNOSIS — M47816 Spondylosis without myelopathy or radiculopathy, lumbar region: Secondary | ICD-10-CM | POA: Diagnosis not present

## 2021-07-10 DIAGNOSIS — M5416 Radiculopathy, lumbar region: Secondary | ICD-10-CM | POA: Diagnosis not present

## 2021-07-10 DIAGNOSIS — E669 Obesity, unspecified: Secondary | ICD-10-CM | POA: Diagnosis not present

## 2021-08-07 DIAGNOSIS — M5136 Other intervertebral disc degeneration, lumbar region: Secondary | ICD-10-CM | POA: Diagnosis not present

## 2021-08-07 DIAGNOSIS — M5416 Radiculopathy, lumbar region: Secondary | ICD-10-CM | POA: Diagnosis not present

## 2021-08-07 DIAGNOSIS — Z79891 Long term (current) use of opiate analgesic: Secondary | ICD-10-CM | POA: Diagnosis not present

## 2021-08-07 DIAGNOSIS — M961 Postlaminectomy syndrome, not elsewhere classified: Secondary | ICD-10-CM | POA: Diagnosis not present

## 2021-08-07 DIAGNOSIS — Z1389 Encounter for screening for other disorder: Secondary | ICD-10-CM | POA: Diagnosis not present

## 2021-08-07 DIAGNOSIS — M47816 Spondylosis without myelopathy or radiculopathy, lumbar region: Secondary | ICD-10-CM | POA: Diagnosis not present

## 2021-08-07 DIAGNOSIS — E669 Obesity, unspecified: Secondary | ICD-10-CM | POA: Diagnosis not present

## 2021-09-05 DIAGNOSIS — M961 Postlaminectomy syndrome, not elsewhere classified: Secondary | ICD-10-CM | POA: Diagnosis not present

## 2021-09-05 DIAGNOSIS — M5136 Other intervertebral disc degeneration, lumbar region: Secondary | ICD-10-CM | POA: Diagnosis not present

## 2021-09-05 DIAGNOSIS — E669 Obesity, unspecified: Secondary | ICD-10-CM | POA: Diagnosis not present

## 2021-09-05 DIAGNOSIS — M47816 Spondylosis without myelopathy or radiculopathy, lumbar region: Secondary | ICD-10-CM | POA: Diagnosis not present

## 2021-09-05 DIAGNOSIS — Z79891 Long term (current) use of opiate analgesic: Secondary | ICD-10-CM | POA: Diagnosis not present

## 2021-09-05 DIAGNOSIS — Z1389 Encounter for screening for other disorder: Secondary | ICD-10-CM | POA: Diagnosis not present

## 2021-09-05 DIAGNOSIS — M5416 Radiculopathy, lumbar region: Secondary | ICD-10-CM | POA: Diagnosis not present

## 2021-10-11 DIAGNOSIS — Z1389 Encounter for screening for other disorder: Secondary | ICD-10-CM | POA: Diagnosis not present

## 2021-10-11 DIAGNOSIS — E669 Obesity, unspecified: Secondary | ICD-10-CM | POA: Diagnosis not present

## 2021-10-11 DIAGNOSIS — M5136 Other intervertebral disc degeneration, lumbar region: Secondary | ICD-10-CM | POA: Diagnosis not present

## 2021-10-11 DIAGNOSIS — M5416 Radiculopathy, lumbar region: Secondary | ICD-10-CM | POA: Diagnosis not present

## 2021-10-11 DIAGNOSIS — M961 Postlaminectomy syndrome, not elsewhere classified: Secondary | ICD-10-CM | POA: Diagnosis not present

## 2021-10-11 DIAGNOSIS — G894 Chronic pain syndrome: Secondary | ICD-10-CM | POA: Diagnosis not present

## 2021-10-11 DIAGNOSIS — M47816 Spondylosis without myelopathy or radiculopathy, lumbar region: Secondary | ICD-10-CM | POA: Diagnosis not present

## 2021-10-11 DIAGNOSIS — Z79891 Long term (current) use of opiate analgesic: Secondary | ICD-10-CM | POA: Diagnosis not present

## 2021-11-08 DIAGNOSIS — M47816 Spondylosis without myelopathy or radiculopathy, lumbar region: Secondary | ICD-10-CM | POA: Diagnosis not present

## 2021-11-08 DIAGNOSIS — E669 Obesity, unspecified: Secondary | ICD-10-CM | POA: Diagnosis not present

## 2021-11-08 DIAGNOSIS — Z1389 Encounter for screening for other disorder: Secondary | ICD-10-CM | POA: Diagnosis not present

## 2021-11-08 DIAGNOSIS — Z79891 Long term (current) use of opiate analgesic: Secondary | ICD-10-CM | POA: Diagnosis not present

## 2021-11-08 DIAGNOSIS — M961 Postlaminectomy syndrome, not elsewhere classified: Secondary | ICD-10-CM | POA: Diagnosis not present

## 2021-11-08 DIAGNOSIS — M5416 Radiculopathy, lumbar region: Secondary | ICD-10-CM | POA: Diagnosis not present

## 2021-11-08 DIAGNOSIS — M5136 Other intervertebral disc degeneration, lumbar region: Secondary | ICD-10-CM | POA: Diagnosis not present

## 2021-11-24 DIAGNOSIS — R768 Other specified abnormal immunological findings in serum: Secondary | ICD-10-CM | POA: Diagnosis not present

## 2021-11-24 DIAGNOSIS — M8580 Other specified disorders of bone density and structure, unspecified site: Secondary | ICD-10-CM | POA: Diagnosis not present

## 2021-11-24 DIAGNOSIS — M19049 Primary osteoarthritis, unspecified hand: Secondary | ICD-10-CM | POA: Diagnosis not present

## 2021-11-24 DIAGNOSIS — I6529 Occlusion and stenosis of unspecified carotid artery: Secondary | ICD-10-CM | POA: Diagnosis not present

## 2021-11-24 DIAGNOSIS — E039 Hypothyroidism, unspecified: Secondary | ICD-10-CM | POA: Diagnosis not present

## 2021-11-24 DIAGNOSIS — E785 Hyperlipidemia, unspecified: Secondary | ICD-10-CM | POA: Diagnosis not present

## 2021-11-24 DIAGNOSIS — I77819 Aortic ectasia, unspecified site: Secondary | ICD-10-CM | POA: Diagnosis not present

## 2021-11-24 DIAGNOSIS — R739 Hyperglycemia, unspecified: Secondary | ICD-10-CM | POA: Diagnosis not present

## 2021-12-20 DIAGNOSIS — Z1389 Encounter for screening for other disorder: Secondary | ICD-10-CM | POA: Diagnosis not present

## 2021-12-20 DIAGNOSIS — M5136 Other intervertebral disc degeneration, lumbar region: Secondary | ICD-10-CM | POA: Diagnosis not present

## 2021-12-20 DIAGNOSIS — M47816 Spondylosis without myelopathy or radiculopathy, lumbar region: Secondary | ICD-10-CM | POA: Diagnosis not present

## 2021-12-20 DIAGNOSIS — Z79891 Long term (current) use of opiate analgesic: Secondary | ICD-10-CM | POA: Diagnosis not present

## 2021-12-20 DIAGNOSIS — E669 Obesity, unspecified: Secondary | ICD-10-CM | POA: Diagnosis not present

## 2021-12-20 DIAGNOSIS — M961 Postlaminectomy syndrome, not elsewhere classified: Secondary | ICD-10-CM | POA: Diagnosis not present

## 2021-12-20 DIAGNOSIS — M5416 Radiculopathy, lumbar region: Secondary | ICD-10-CM | POA: Diagnosis not present

## 2022-01-04 DIAGNOSIS — M5136 Other intervertebral disc degeneration, lumbar region: Secondary | ICD-10-CM | POA: Diagnosis not present

## 2022-01-04 DIAGNOSIS — M25552 Pain in left hip: Secondary | ICD-10-CM | POA: Diagnosis not present

## 2022-01-17 DIAGNOSIS — M5136 Other intervertebral disc degeneration, lumbar region: Secondary | ICD-10-CM | POA: Diagnosis not present

## 2022-01-17 DIAGNOSIS — G894 Chronic pain syndrome: Secondary | ICD-10-CM | POA: Diagnosis not present

## 2022-01-17 DIAGNOSIS — M5416 Radiculopathy, lumbar region: Secondary | ICD-10-CM | POA: Diagnosis not present

## 2022-01-17 DIAGNOSIS — E669 Obesity, unspecified: Secondary | ICD-10-CM | POA: Diagnosis not present

## 2022-01-17 DIAGNOSIS — Z1389 Encounter for screening for other disorder: Secondary | ICD-10-CM | POA: Diagnosis not present

## 2022-01-17 DIAGNOSIS — M961 Postlaminectomy syndrome, not elsewhere classified: Secondary | ICD-10-CM | POA: Diagnosis not present

## 2022-01-17 DIAGNOSIS — Z79891 Long term (current) use of opiate analgesic: Secondary | ICD-10-CM | POA: Diagnosis not present

## 2022-01-17 DIAGNOSIS — M47816 Spondylosis without myelopathy or radiculopathy, lumbar region: Secondary | ICD-10-CM | POA: Diagnosis not present

## 2022-01-24 DIAGNOSIS — M5416 Radiculopathy, lumbar region: Secondary | ICD-10-CM | POA: Diagnosis not present

## 2022-01-24 DIAGNOSIS — M545 Low back pain, unspecified: Secondary | ICD-10-CM | POA: Diagnosis not present

## 2022-01-24 DIAGNOSIS — M5136 Other intervertebral disc degeneration, lumbar region: Secondary | ICD-10-CM | POA: Diagnosis not present

## 2022-02-19 DIAGNOSIS — M961 Postlaminectomy syndrome, not elsewhere classified: Secondary | ICD-10-CM | POA: Diagnosis not present

## 2022-02-19 DIAGNOSIS — Z79891 Long term (current) use of opiate analgesic: Secondary | ICD-10-CM | POA: Diagnosis not present

## 2022-02-19 DIAGNOSIS — Z1389 Encounter for screening for other disorder: Secondary | ICD-10-CM | POA: Diagnosis not present

## 2022-02-19 DIAGNOSIS — M5416 Radiculopathy, lumbar region: Secondary | ICD-10-CM | POA: Diagnosis not present

## 2022-02-19 DIAGNOSIS — M47816 Spondylosis without myelopathy or radiculopathy, lumbar region: Secondary | ICD-10-CM | POA: Diagnosis not present

## 2022-02-19 DIAGNOSIS — M5136 Other intervertebral disc degeneration, lumbar region: Secondary | ICD-10-CM | POA: Diagnosis not present

## 2022-02-19 DIAGNOSIS — E669 Obesity, unspecified: Secondary | ICD-10-CM | POA: Diagnosis not present

## 2022-02-20 DIAGNOSIS — M5416 Radiculopathy, lumbar region: Secondary | ICD-10-CM | POA: Diagnosis not present

## 2022-02-20 DIAGNOSIS — M961 Postlaminectomy syndrome, not elsewhere classified: Secondary | ICD-10-CM | POA: Diagnosis not present

## 2022-02-21 DIAGNOSIS — Z6837 Body mass index (BMI) 37.0-37.9, adult: Secondary | ICD-10-CM | POA: Diagnosis not present

## 2022-02-21 DIAGNOSIS — E669 Obesity, unspecified: Secondary | ICD-10-CM | POA: Diagnosis not present

## 2022-02-21 DIAGNOSIS — R768 Other specified abnormal immunological findings in serum: Secondary | ICD-10-CM | POA: Diagnosis not present

## 2022-02-21 DIAGNOSIS — M79642 Pain in left hand: Secondary | ICD-10-CM | POA: Diagnosis not present

## 2022-02-21 DIAGNOSIS — R5383 Other fatigue: Secondary | ICD-10-CM | POA: Diagnosis not present

## 2022-02-21 DIAGNOSIS — M79641 Pain in right hand: Secondary | ICD-10-CM | POA: Diagnosis not present

## 2022-02-21 DIAGNOSIS — M1991 Primary osteoarthritis, unspecified site: Secondary | ICD-10-CM | POA: Diagnosis not present

## 2022-02-21 DIAGNOSIS — M545 Low back pain, unspecified: Secondary | ICD-10-CM | POA: Diagnosis not present

## 2022-03-07 DIAGNOSIS — M1991 Primary osteoarthritis, unspecified site: Secondary | ICD-10-CM | POA: Diagnosis not present

## 2022-03-07 DIAGNOSIS — M79642 Pain in left hand: Secondary | ICD-10-CM | POA: Diagnosis not present

## 2022-03-07 DIAGNOSIS — M79641 Pain in right hand: Secondary | ICD-10-CM | POA: Diagnosis not present

## 2022-03-07 DIAGNOSIS — E669 Obesity, unspecified: Secondary | ICD-10-CM | POA: Diagnosis not present

## 2022-03-07 DIAGNOSIS — M545 Low back pain, unspecified: Secondary | ICD-10-CM | POA: Diagnosis not present

## 2022-03-07 DIAGNOSIS — Z6836 Body mass index (BMI) 36.0-36.9, adult: Secondary | ICD-10-CM | POA: Diagnosis not present

## 2022-03-07 DIAGNOSIS — M0609 Rheumatoid arthritis without rheumatoid factor, multiple sites: Secondary | ICD-10-CM | POA: Diagnosis not present

## 2022-03-14 DIAGNOSIS — M5416 Radiculopathy, lumbar region: Secondary | ICD-10-CM | POA: Diagnosis not present

## 2022-03-19 DIAGNOSIS — L209 Atopic dermatitis, unspecified: Secondary | ICD-10-CM | POA: Diagnosis not present

## 2022-03-20 DIAGNOSIS — M961 Postlaminectomy syndrome, not elsewhere classified: Secondary | ICD-10-CM | POA: Diagnosis not present

## 2022-03-20 DIAGNOSIS — M5416 Radiculopathy, lumbar region: Secondary | ICD-10-CM | POA: Diagnosis not present

## 2022-03-20 DIAGNOSIS — M47816 Spondylosis without myelopathy or radiculopathy, lumbar region: Secondary | ICD-10-CM | POA: Diagnosis not present

## 2022-03-20 DIAGNOSIS — Z79891 Long term (current) use of opiate analgesic: Secondary | ICD-10-CM | POA: Diagnosis not present

## 2022-03-20 DIAGNOSIS — Z1389 Encounter for screening for other disorder: Secondary | ICD-10-CM | POA: Diagnosis not present

## 2022-03-20 DIAGNOSIS — M5136 Other intervertebral disc degeneration, lumbar region: Secondary | ICD-10-CM | POA: Diagnosis not present

## 2022-03-20 DIAGNOSIS — E669 Obesity, unspecified: Secondary | ICD-10-CM | POA: Diagnosis not present

## 2022-04-02 DIAGNOSIS — M961 Postlaminectomy syndrome, not elsewhere classified: Secondary | ICD-10-CM | POA: Diagnosis not present

## 2022-04-02 DIAGNOSIS — M5416 Radiculopathy, lumbar region: Secondary | ICD-10-CM | POA: Diagnosis not present

## 2022-04-10 DIAGNOSIS — E860 Dehydration: Secondary | ICD-10-CM | POA: Diagnosis not present

## 2022-04-20 IMAGING — CT CT ANGIO NECK
1 of 4 series · 2 of 16 positions shown · IV contrast (iopamidol)
Comparison: 0479

CLINICAL DATA: Carotid stenosis screening, history of bilateral
endarterectomy

EXAM:
CT ANGIOGRAPHY HEAD AND NECK
TECHNIQUE: Multidetector CT imaging of the head and neck was performed using
the standard protocol during bolus administration of intravenous
contrast. Multiplanar CT image reconstructions and MIPs were
obtained to evaluate the vascular anatomy. Carotid stenosis
measurements (when applicable) are obtained utilizing NASCET
criteria, using the distal internal carotid diameter as the
denominator.
CONTRAST:  75mL HAS80R-UZG IOPAMIDOL (HAS80R-UZG) INJECTION 76%

[Series 7: head/neck angio · axial · 0.46mm/px · z∈[-282,-158]mm · 2 of 186 slices shown]
[im 62/186  soft-tissue]
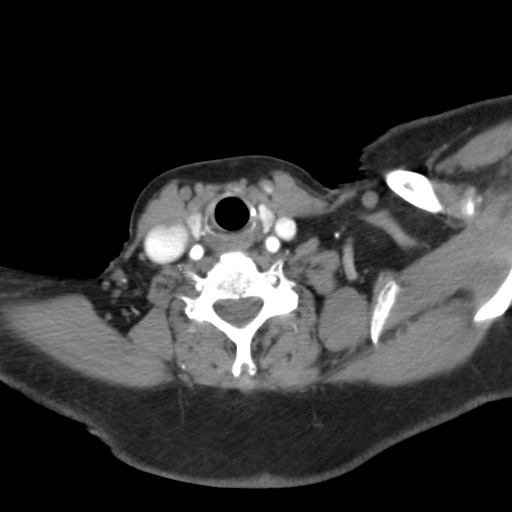
[im 124/186  bone]
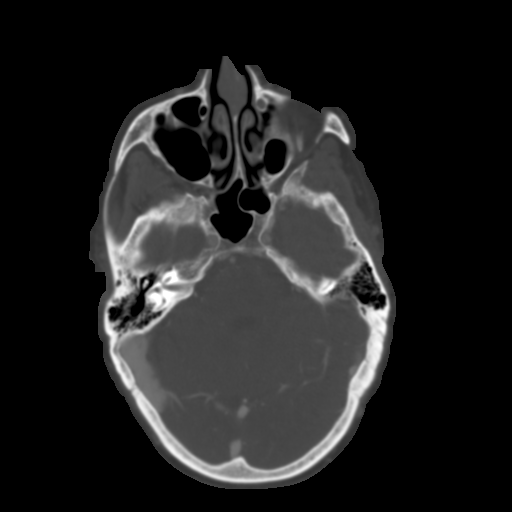

[2 of 16 positions shown; findings below may reference images not displayed]

FINDINGS: CT HEAD

Brain: There is no acute intracranial hemorrhage, mass effect, or
edema. Gray-white differentiation is preserved. Patchy
hypoattenuation in the supratentorial white matter is nonspecific
but probably reflects mild to moderate chronic microvascular
ischemic changes. There is no extra-axial fluid collection.
Ventricles and sulci are within normal limits in size and
configuration.

Vascular: Better evaluated on CTA portion.

Skull: Calvarium is unremarkable.

Sinuses/Orbits: No acute finding.

Other: None.

Review of the MIP images confirms the above findings

CTA NECK

Aortic arch: Mixed plaque along the aortic arch and patent great
vessel origins. The origins are poorly evaluated due to artifact.

Right carotid system: Patent. Post endarterectomy. Minimal stenosis
of the proximal internal carotid.

Left carotid system: Patent. Post endarterectomy. Minimal stenosis
of the proximal internal carotid.

Vertebral arteries: Patent. Left vertebral artery is mildly
dominant. Left vertebral origin is not well evaluated due to
artifact with probable similar stenosis.

Skeleton: Facet predominant cervical spine degenerative changes.
Degenerative changes of the temporomandibular joints.

Other neck: No significant abnormality.

Upper chest: No apical lung mass.

Review of the MIP images confirms the above findings

CTA HEAD

Anterior circulation: Intracranial internal carotid arteries are
patent. Calcified plaque along the cavernous and paraclinoid
portions causing mild to moderate stenosis. The anterior and middle
cerebral arteries are patent.

Posterior circulation: Intracranial vertebral arteries and basilar
artery are patent. Major cerebellar artery origins are patent.
Posterior cerebral arteries are patent. A right posterior
communicating artery is present.

Venous sinuses: Patent as allowed by contrast bolus timing.

Review of the MIP images confirms the above findings
IMPRESSION: No acute intracranial abnormality. Chronic microvascular ischemic
changes.

Post bilateral carotid endarterectomies. Minimal stenosis of the
proximal internal carotids.

Left tubal origin not well evaluated due to artifact but probable
small or stenosis.

No significant intracranial stenosis.

## 2022-04-20 IMAGING — CT CT ANGIO HEAD
1 of 4 series · 6 of 30 positions shown · IV contrast (iopamidol)
Comparison: 0479

CLINICAL DATA: Carotid stenosis screening, history of bilateral
endarterectomy

EXAM:
CT ANGIOGRAPHY HEAD AND NECK
TECHNIQUE: Multidetector CT imaging of the head and neck was performed using
the standard protocol during bolus administration of intravenous
contrast. Multiplanar CT image reconstructions and MIPs were
obtained to evaluate the vascular anatomy. Carotid stenosis
measurements (when applicable) are obtained utilizing NASCET
criteria, using the distal internal carotid diameter as the
denominator.
CONTRAST:  75mL HAS80R-UZG IOPAMIDOL (HAS80R-UZG) INJECTION 76%

[Series 7: head/neck angio · axial · 0.46mm/px · z∈[-352,-88]mm · 6 of 186 slices shown]
[im 27/186  brain]
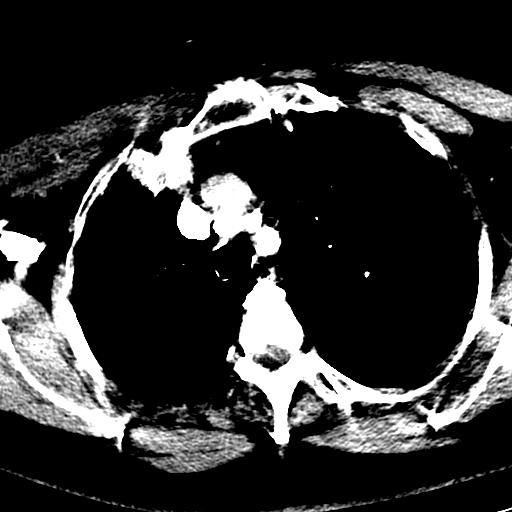
[im 53/186  bone]
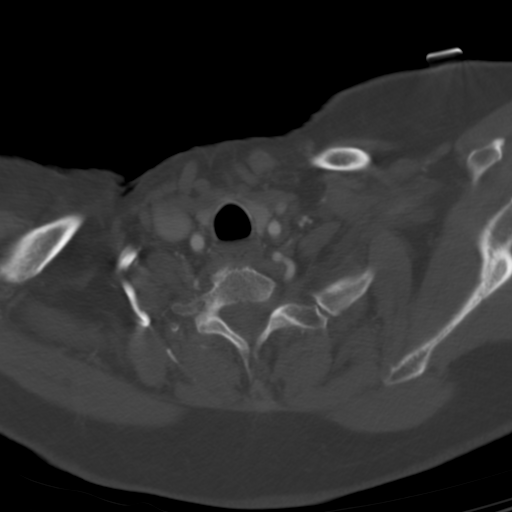
[im 80/186  brain]
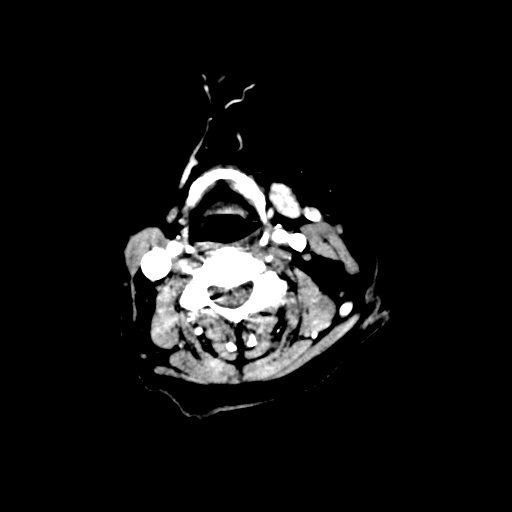
[im 106/186  bone]
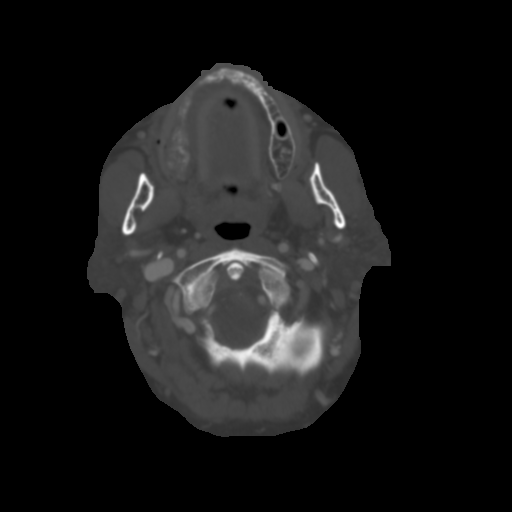
[im 133/186  brain]
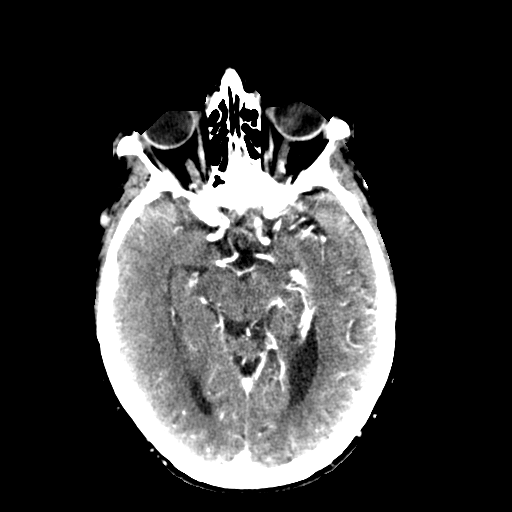
[im 159/186  bone]
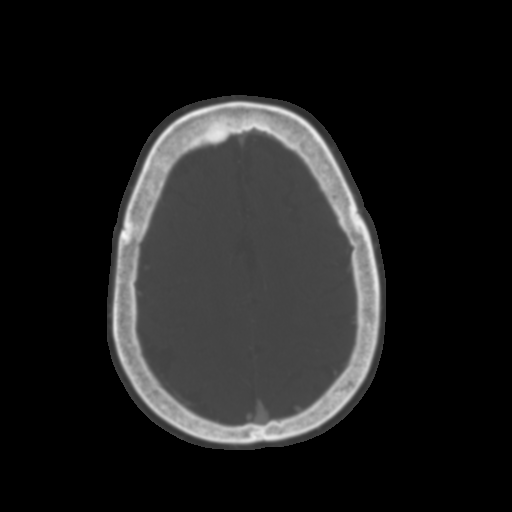

[6 of 30 positions shown; findings below may reference images not displayed]

FINDINGS: CT HEAD

Brain: There is no acute intracranial hemorrhage, mass effect, or
edema. Gray-white differentiation is preserved. Patchy
hypoattenuation in the supratentorial white matter is nonspecific
but probably reflects mild to moderate chronic microvascular
ischemic changes. There is no extra-axial fluid collection.
Ventricles and sulci are within normal limits in size and
configuration.

Vascular: Better evaluated on CTA portion.

Skull: Calvarium is unremarkable.

Sinuses/Orbits: No acute finding.

Other: None.

Review of the MIP images confirms the above findings

CTA NECK

Aortic arch: Mixed plaque along the aortic arch and patent great
vessel origins. The origins are poorly evaluated due to artifact.

Right carotid system: Patent. Post endarterectomy. Minimal stenosis
of the proximal internal carotid.

Left carotid system: Patent. Post endarterectomy. Minimal stenosis
of the proximal internal carotid.

Vertebral arteries: Patent. Left vertebral artery is mildly
dominant. Left vertebral origin is not well evaluated due to
artifact with probable similar stenosis.

Skeleton: Facet predominant cervical spine degenerative changes.
Degenerative changes of the temporomandibular joints.

Other neck: No significant abnormality.

Upper chest: No apical lung mass.

Review of the MIP images confirms the above findings

CTA HEAD

Anterior circulation: Intracranial internal carotid arteries are
patent. Calcified plaque along the cavernous and paraclinoid
portions causing mild to moderate stenosis. The anterior and middle
cerebral arteries are patent.

Posterior circulation: Intracranial vertebral arteries and basilar
artery are patent. Major cerebellar artery origins are patent.
Posterior cerebral arteries are patent. A right posterior
communicating artery is present.

Venous sinuses: Patent as allowed by contrast bolus timing.

Review of the MIP images confirms the above findings
IMPRESSION: No acute intracranial abnormality. Chronic microvascular ischemic
changes.

Post bilateral carotid endarterectomies. Minimal stenosis of the
proximal internal carotids.

Left tubal origin not well evaluated due to artifact but probable
small or stenosis.

No significant intracranial stenosis.

## 2022-04-23 DIAGNOSIS — M5416 Radiculopathy, lumbar region: Secondary | ICD-10-CM | POA: Diagnosis not present

## 2022-04-25 DIAGNOSIS — Z79891 Long term (current) use of opiate analgesic: Secondary | ICD-10-CM | POA: Diagnosis not present

## 2022-04-25 DIAGNOSIS — E669 Obesity, unspecified: Secondary | ICD-10-CM | POA: Diagnosis not present

## 2022-04-25 DIAGNOSIS — M5136 Other intervertebral disc degeneration, lumbar region: Secondary | ICD-10-CM | POA: Diagnosis not present

## 2022-04-25 DIAGNOSIS — G894 Chronic pain syndrome: Secondary | ICD-10-CM | POA: Diagnosis not present

## 2022-04-25 DIAGNOSIS — M47816 Spondylosis without myelopathy or radiculopathy, lumbar region: Secondary | ICD-10-CM | POA: Diagnosis not present

## 2022-04-25 DIAGNOSIS — M5416 Radiculopathy, lumbar region: Secondary | ICD-10-CM | POA: Diagnosis not present

## 2022-04-25 DIAGNOSIS — M961 Postlaminectomy syndrome, not elsewhere classified: Secondary | ICD-10-CM | POA: Diagnosis not present

## 2022-04-25 DIAGNOSIS — Z1389 Encounter for screening for other disorder: Secondary | ICD-10-CM | POA: Diagnosis not present

## 2022-04-26 DIAGNOSIS — M5416 Radiculopathy, lumbar region: Secondary | ICD-10-CM | POA: Diagnosis not present

## 2022-05-16 DIAGNOSIS — M5126 Other intervertebral disc displacement, lumbar region: Secondary | ICD-10-CM | POA: Diagnosis not present

## 2022-05-16 DIAGNOSIS — M5416 Radiculopathy, lumbar region: Secondary | ICD-10-CM | POA: Diagnosis not present

## 2022-05-24 DIAGNOSIS — Z6837 Body mass index (BMI) 37.0-37.9, adult: Secondary | ICD-10-CM | POA: Diagnosis not present

## 2022-05-24 DIAGNOSIS — M5416 Radiculopathy, lumbar region: Secondary | ICD-10-CM | POA: Diagnosis not present

## 2022-05-25 DIAGNOSIS — M5136 Other intervertebral disc degeneration, lumbar region: Secondary | ICD-10-CM | POA: Diagnosis not present

## 2022-05-25 DIAGNOSIS — M961 Postlaminectomy syndrome, not elsewhere classified: Secondary | ICD-10-CM | POA: Diagnosis not present

## 2022-05-29 DIAGNOSIS — M5416 Radiculopathy, lumbar region: Secondary | ICD-10-CM | POA: Diagnosis not present

## 2022-05-29 DIAGNOSIS — M5136 Other intervertebral disc degeneration, lumbar region: Secondary | ICD-10-CM | POA: Diagnosis not present

## 2022-05-29 DIAGNOSIS — M961 Postlaminectomy syndrome, not elsewhere classified: Secondary | ICD-10-CM | POA: Diagnosis not present

## 2022-05-29 DIAGNOSIS — E669 Obesity, unspecified: Secondary | ICD-10-CM | POA: Diagnosis not present

## 2022-05-29 DIAGNOSIS — Z1389 Encounter for screening for other disorder: Secondary | ICD-10-CM | POA: Diagnosis not present

## 2022-05-29 DIAGNOSIS — Z79891 Long term (current) use of opiate analgesic: Secondary | ICD-10-CM | POA: Diagnosis not present

## 2022-05-29 DIAGNOSIS — M47816 Spondylosis without myelopathy or radiculopathy, lumbar region: Secondary | ICD-10-CM | POA: Diagnosis not present

## 2022-06-04 DIAGNOSIS — I6529 Occlusion and stenosis of unspecified carotid artery: Secondary | ICD-10-CM | POA: Diagnosis not present

## 2022-06-04 DIAGNOSIS — Z1231 Encounter for screening mammogram for malignant neoplasm of breast: Secondary | ICD-10-CM | POA: Diagnosis not present

## 2022-06-04 DIAGNOSIS — M06049 Rheumatoid arthritis without rheumatoid factor, unspecified hand: Secondary | ICD-10-CM | POA: Diagnosis not present

## 2022-06-04 DIAGNOSIS — I77819 Aortic ectasia, unspecified site: Secondary | ICD-10-CM | POA: Diagnosis not present

## 2022-06-04 DIAGNOSIS — Z1331 Encounter for screening for depression: Secondary | ICD-10-CM | POA: Diagnosis not present

## 2022-06-04 DIAGNOSIS — M8580 Other specified disorders of bone density and structure, unspecified site: Secondary | ICD-10-CM | POA: Diagnosis not present

## 2022-06-04 DIAGNOSIS — Z9181 History of falling: Secondary | ICD-10-CM | POA: Diagnosis not present

## 2022-06-04 DIAGNOSIS — E039 Hypothyroidism, unspecified: Secondary | ICD-10-CM | POA: Diagnosis not present

## 2022-06-04 DIAGNOSIS — Z79899 Other long term (current) drug therapy: Secondary | ICD-10-CM | POA: Diagnosis not present

## 2022-06-04 DIAGNOSIS — N952 Postmenopausal atrophic vaginitis: Secondary | ICD-10-CM | POA: Diagnosis not present

## 2022-06-04 DIAGNOSIS — E785 Hyperlipidemia, unspecified: Secondary | ICD-10-CM | POA: Diagnosis not present

## 2022-06-05 ENCOUNTER — Other Ambulatory Visit: Payer: Self-pay | Admitting: Neurological Surgery

## 2022-06-15 ENCOUNTER — Encounter (HOSPITAL_COMMUNITY): Payer: Self-pay

## 2022-06-15 NOTE — Pre-Procedure Instructions (Signed)
Surgical Instructions    Your procedure is scheduled on Friday, January 26th.  Report to [facility] Main Entrance "A" at 07:45 A.M., then check in with the Admitting office.  Call this number if you have problems the morning of surgery:  [phone]  If you have any questions prior to your surgery date call [phone]: Open Monday-Friday 8am-4pm If you experience any cold or flu symptoms such as cough, fever, chills, shortness of breath, etc. between now and your scheduled surgery, please notify us at the above number.     Remember:  Do not eat or drink after midnight the night before your surgery     Take these medicines the morning of surgery with A SIP OF WATER  HYDROcodone-acetaminophen ([place])- if needed  As of today, STOP taking any Aspirin (unless otherwise instructed by your surgeon) Aleve, Naproxen, Ibuprofen, Motrin, Advil, Goody's, BC's, all herbal medications, fish oil, and all vitamins. This includes celecoxib (CELEBREX).                     Do NOT Smoke (Tobacco/Vaping) for 24 hours prior to your procedure.  If you use a CPAP at night, you may bring your mask/headgear for your overnight stay.   Contacts, glasses, piercing's, hearing aid's, dentures or partials may not be worn into surgery, please bring cases for these belongings.    For patients admitted to the hospital, discharge time will be determined by your treatment team.   Patients discharged the day of surgery will not be allowed to drive home, and someone needs to stay with them for 24 hours.  SURGICAL WAITING ROOM VISITATION Patients having surgery or a procedure may have no more than 2 support people in the waiting area - these visitors may rotate.   Children under the age of 76 must have an adult with them who is not the patient. If the patient needs to stay at the hospital during part of their recovery, the visitor guidelines for inpatient rooms apply. Pre-op nurse will coordinate an appropriate time  for 1 support person to accompany patient in pre-op.  This support person may not rotate.   Please refer to the [facility] website for the visitor guidelines for Inpatients (after your surgery is over and you are in a regular room).    Special instructions:   [place]- Preparing For Surgery  Before surgery, you can play an important role. Because skin is not sterile, your skin needs to be as free of germs as possible. You can reduce the number of germs on your skin by washing with CHG (chlorahexidine gluconate) Soap before surgery.  CHG is an antiseptic cleaner which kills germs and bonds with the skin to continue killing germs even after washing.    Oral Hygiene is also important to reduce your risk of infection.  Remember - BRUSH YOUR TEETH THE MORNING OF SURGERY WITH YOUR REGULAR TOOTHPASTE  Please do not use if you have an allergy to CHG or antibacterial soaps. If your skin becomes reddened/irritated stop using the CHG.  Do not shave (including legs and underarms) for at least 48 hours prior to first CHG shower. It is OK to shave your face.  Please follow these instructions carefully.   Shower the NIGHT BEFORE SURGERY and the MORNING OF SURGERY  If you chose to wash your hair, wash your hair first as usual with your normal shampoo.  After you shampoo, rinse your hair and body thoroughly to remove the shampoo.  Use  CHG Soap as you would any other liquid soap. You can apply CHG directly to the skin and wash gently with a scrungie or a clean washcloth.   Apply the CHG Soap to your body ONLY FROM THE NECK DOWN.  Do not use on open wounds or open sores. Avoid contact with your eyes, ears, mouth and genitals (private parts). Wash Face and genitals (private parts)  with your normal soap.   Wash thoroughly, paying special attention to the area where your surgery will be performed.  Thoroughly rinse your body with warm water from the neck down.  DO NOT shower/wash with your normal soap  after using and rinsing off the CHG Soap.  Pat yourself dry with a CLEAN TOWEL.  Wear CLEAN PAJAMAS to bed the night before surgery  Place CLEAN SHEETS on your bed the night before your surgery  DO NOT SLEEP WITH PETS.   Day of Surgery: Take a shower with CHG soap. Do not wear jewelry or makeup Do not wear lotions, powders, perfumes, or deodorant. Do not shave 48 hours prior to surgery.   Do not bring valuables to the hospital. [facility] is not responsible for any belongings or valuables. Do not wear nail polish, gel polish, artificial nails, or any other type of covering on natural nails (fingers and toes) If you have artificial nails or gel coating that need to be removed by a nail salon, please have this removed prior to surgery. Artificial nails or gel coating may interfere with anesthesia's ability to adequately monitor your vital signs. Wear Clean/Comfortable clothing the morning of surgery Remember to brush your teeth WITH YOUR REGULAR TOOTHPASTE.   Please read over the following fact sheets that you were given.    If you received a COVID test during your pre-op visit  it is requested that you wear a mask when out in public, stay away from anyone that may not be feeling well and notify your surgeon if you develop symptoms. If you have been in contact with anyone that has tested positive in the last 10 days please notify you surgeon.

## 2022-06-18 ENCOUNTER — Encounter (HOSPITAL_COMMUNITY)
Admission: RE | Admit: 2022-06-18 | Discharge: 2022-06-18 | Disposition: A | Discharge status: Home / Self Care | Payer: Medicare HMO | Source: Ambulatory Visit | Attending: Neurological Surgery | Admitting: Neurological Surgery

## 2022-06-18 ENCOUNTER — Other Ambulatory Visit: Payer: Self-pay

## 2022-06-18 ENCOUNTER — Encounter (HOSPITAL_COMMUNITY): Payer: Self-pay

## 2022-06-18 VITALS — BP 159/89 | HR 69 | Temp 97.7°F | Resp 17 | Ht 60.5 in | Wt 202.1 lb

## 2022-06-18 DIAGNOSIS — I10 Essential (primary) hypertension: Secondary | ICD-10-CM

## 2022-06-18 DIAGNOSIS — Z01818 Encounter for other preprocedural examination: Secondary | ICD-10-CM

## 2022-06-18 HISTORY — DX: Malignant (primary) neoplasm, unspecified: C80.1

## 2022-06-18 HISTORY — DX: Unspecified osteoarthritis, unspecified site: M19.90

## 2022-06-18 HISTORY — DX: Occlusion and stenosis of unspecified carotid artery: I65.29

## 2022-06-18 HISTORY — DX: Aortic ectasia, unspecified site: I77.819

## 2022-06-18 HISTORY — DX: Abdominal aortic aneurysm, without rupture, unspecified: I71.40

## 2022-06-18 LAB — CBC
HCT: 39.8 % (ref 36.0–46.0)
Hemoglobin: 13 g/dL (ref 12.0–15.0)
MCH: 32.9 pg (ref 26.0–34.0)
MCHC: 32.7 g/dL (ref 30.0–36.0)
MCV: 100.8 fL — ABNORMAL HIGH (ref 80.0–100.0)
Platelets: 205 10*3/uL (ref 150–400)
RBC: 3.95 MIL/uL (ref 3.87–5.11)
RDW: 12.8 % (ref 11.5–15.5)
WBC: 7.4 10*3/uL (ref 4.0–10.5)
nRBC: 0 % (ref 0.0–0.2)

## 2022-06-18 LAB — SURGICAL PCR SCREEN
MRSA, PCR: NEGATIVE
Staphylococcus aureus: NEGATIVE

## 2022-06-18 LAB — PROTIME-INR
INR: 1 (ref 0.8–1.2)
Prothrombin Time: 13 seconds (ref 11.4–15.2)

## 2022-06-18 MED ORDER — OXYMETAZOLINE HCL 0.05 % NA SOLN
NASAL | Status: AC
  Filled 2022-06-18: qty 30

## 2022-06-18 NOTE — Progress Notes (Signed)
PCP - DR. Maura Hamrick Cardiologist - denies  PPM/ICD - denies   Chest x-ray - 03/06/12 EKG - 06/18/22 Stress Test - 15-20 years ago per pt, normal per pt ECHO - denies Cardiac Cath - denies  Sleep Study - denies   DM- denies    ASA/Blood Thinner Instructions: n/a   ERAS Protcol - no, NPO   COVID TEST- n/a   Anesthesia review: no  Patient denies shortness of breath, fever, cough and chest pain at PAT appointment   All instructions explained to the patient, with a verbal understanding of the material. Patient agrees to go over the instructions while at home for a better understanding. The opportunity to ask questions was provided.

## 2022-06-21 DIAGNOSIS — Z780 Asymptomatic menopausal state: Secondary | ICD-10-CM | POA: Diagnosis not present

## 2022-06-21 DIAGNOSIS — Z1231 Encounter for screening mammogram for malignant neoplasm of breast: Secondary | ICD-10-CM | POA: Diagnosis not present

## 2022-06-21 DIAGNOSIS — M8588 Other specified disorders of bone density and structure, other site: Secondary | ICD-10-CM | POA: Diagnosis not present

## 2022-06-21 DIAGNOSIS — M8580 Other specified disorders of bone density and structure, unspecified site: Secondary | ICD-10-CM | POA: Diagnosis not present

## 2022-06-22 ENCOUNTER — Ambulatory Visit (HOSPITAL_COMMUNITY): Admit: 2022-06-22 | Payer: Medicare HMO | Admitting: Neurological Surgery

## 2022-06-22 SURGERY — LUMBAR LAMINECTOMY/DECOMPRESSION MICRODISCECTOMY 1 LEVEL
Anesthesia: General | Site: Back | Laterality: Left

## 2022-06-28 DIAGNOSIS — Z79891 Long term (current) use of opiate analgesic: Secondary | ICD-10-CM | POA: Diagnosis not present

## 2022-06-28 DIAGNOSIS — M47816 Spondylosis without myelopathy or radiculopathy, lumbar region: Secondary | ICD-10-CM | POA: Diagnosis not present

## 2022-06-28 DIAGNOSIS — Z1389 Encounter for screening for other disorder: Secondary | ICD-10-CM | POA: Diagnosis not present

## 2022-06-28 DIAGNOSIS — M5416 Radiculopathy, lumbar region: Secondary | ICD-10-CM | POA: Diagnosis not present

## 2022-06-28 DIAGNOSIS — M5136 Other intervertebral disc degeneration, lumbar region: Secondary | ICD-10-CM | POA: Diagnosis not present

## 2022-06-28 DIAGNOSIS — M961 Postlaminectomy syndrome, not elsewhere classified: Secondary | ICD-10-CM | POA: Diagnosis not present

## 2022-06-28 DIAGNOSIS — E669 Obesity, unspecified: Secondary | ICD-10-CM | POA: Diagnosis not present

## 2022-07-20 DIAGNOSIS — M5416 Radiculopathy, lumbar region: Secondary | ICD-10-CM | POA: Diagnosis not present

## 2022-07-20 DIAGNOSIS — M2569 Stiffness of other specified joint, not elsewhere classified: Secondary | ICD-10-CM | POA: Diagnosis not present

## 2022-07-20 DIAGNOSIS — R2689 Other abnormalities of gait and mobility: Secondary | ICD-10-CM | POA: Diagnosis not present

## 2022-07-20 DIAGNOSIS — R293 Abnormal posture: Secondary | ICD-10-CM | POA: Diagnosis not present

## 2022-07-24 DIAGNOSIS — R2689 Other abnormalities of gait and mobility: Secondary | ICD-10-CM | POA: Diagnosis not present

## 2022-07-24 DIAGNOSIS — M2569 Stiffness of other specified joint, not elsewhere classified: Secondary | ICD-10-CM | POA: Diagnosis not present

## 2022-07-24 DIAGNOSIS — R293 Abnormal posture: Secondary | ICD-10-CM | POA: Diagnosis not present

## 2022-07-24 DIAGNOSIS — M5416 Radiculopathy, lumbar region: Secondary | ICD-10-CM | POA: Diagnosis not present

## 2022-07-30 ENCOUNTER — Other Ambulatory Visit: Payer: Self-pay | Admitting: *Deleted

## 2022-07-30 DIAGNOSIS — M5416 Radiculopathy, lumbar region: Secondary | ICD-10-CM | POA: Diagnosis not present

## 2022-07-30 DIAGNOSIS — M5136 Other intervertebral disc degeneration, lumbar region: Secondary | ICD-10-CM | POA: Diagnosis not present

## 2022-07-30 DIAGNOSIS — E669 Obesity, unspecified: Secondary | ICD-10-CM | POA: Diagnosis not present

## 2022-07-30 DIAGNOSIS — I6523 Occlusion and stenosis of bilateral carotid arteries: Secondary | ICD-10-CM

## 2022-07-30 DIAGNOSIS — Z1389 Encounter for screening for other disorder: Secondary | ICD-10-CM | POA: Diagnosis not present

## 2022-07-30 DIAGNOSIS — M47816 Spondylosis without myelopathy or radiculopathy, lumbar region: Secondary | ICD-10-CM | POA: Diagnosis not present

## 2022-07-30 DIAGNOSIS — Z79891 Long term (current) use of opiate analgesic: Secondary | ICD-10-CM | POA: Diagnosis not present

## 2022-07-30 DIAGNOSIS — M961 Postlaminectomy syndrome, not elsewhere classified: Secondary | ICD-10-CM | POA: Diagnosis not present

## 2022-07-31 DIAGNOSIS — R2689 Other abnormalities of gait and mobility: Secondary | ICD-10-CM | POA: Diagnosis not present

## 2022-07-31 DIAGNOSIS — R293 Abnormal posture: Secondary | ICD-10-CM | POA: Diagnosis not present

## 2022-07-31 DIAGNOSIS — M2569 Stiffness of other specified joint, not elsewhere classified: Secondary | ICD-10-CM | POA: Diagnosis not present

## 2022-07-31 DIAGNOSIS — M5416 Radiculopathy, lumbar region: Secondary | ICD-10-CM | POA: Diagnosis not present

## 2022-08-02 ENCOUNTER — Ambulatory Visit (HOSPITAL_COMMUNITY): Payer: Medicare HMO

## 2022-08-02 ENCOUNTER — Ambulatory Visit: Payer: Medicare HMO

## 2022-08-08 ENCOUNTER — Other Ambulatory Visit: Payer: Self-pay | Admitting: Neurological Surgery

## 2022-08-14 NOTE — Progress Notes (Unsigned)
HISTORY AND PHYSICAL     CC:  follow up. Requesting Provider:  Dagoberto Ligas, PA-C  HPI: This is a 82 y.o. female here for follow up for carotid artery stenosis.   She has hx of left carotid endarterectomy in January 2015 and right carotid endarterectomy in January 2014 at [facility].  Both surgeries were performed for asymptomatic carotid stenosis.   She also has hx of aortic ectasia.   Pt was last seen 06/14/2021 and at that time she was doing well.  Her blurry vision had resolved without recurrent sx.  She had a CTA head/neck March 2022, which revealed widely patent endarterectomy sites.  She was not having any neurological sx or new abdominal or back pain.  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   Pt returns today for follow up.    Pt denies any amaurosis fugax, speech difficulties, weakness, numbness, paralysis or clumsiness or facial droop.  She does not have claudication or rest pain. She is not taking asa due to the tylenol in her pain medication and it causes bruising on her skin and pain management MD told her to stop it.   She does have back pain due to spinal stenosis and is having upcoming surgery for this.  She does not have any new abdominal or back pain.    The pt is on a statin for cholesterol management.  The pt is not on a daily aspirin.   Other AC:  none The pt is not on medication for hypertension.   The pt does not have diabetes Tobacco hx:  former  Pt does  have family hx of AAA.  She is one of 9 children and she and her brother are the only ones with AAA.  Her brother's AAA has been repaired.   Past Medical History:  Diagnosis Date   AAA (abdominal aortic aneurysm) ([place])    Aortic ectasia (HCC)    Cancer (HCC)    hx of skin cancer   Carotid artery stenosis    Family history of bladder cancer    Family history of colon cancer    Family history of genetic mutation for hereditary nonpolyposis colorectal  cancer (HNPCC)    Family history of kidney cancer    Lung nodule seen on imaging study 2004   [facility] per pt, she was told "it was the size of a grain of salt"   Osteoarthritis     Past Surgical History:  Procedure Laterality Date   ABDOMINAL HYSTERECTOMY  [place], 1973, 2014   CAROTID ENDARTERECTOMY Left 2015   CAROTID ENDARTERECTOMY Right 2014   CARPAL TUNNEL RELEASE Bilateral 1960   CATARACT EXTRACTION Bilateral 2017   CHOLECYSTECTOMY  2016   TOTAL KNEE ARTHROPLASTY Right 2014    Allergies  Allergen Reactions   Other Shortness Of Breath and Rash    Quinolone antibiotics-short of breath, and rash   Penicillins Hives and Shortness Of Breath    Difficulty swallowing, difficulty breathing, hives   Codeine Nausea Only   Statins Other (See Comments)    Muscle pain/spasms     Current Outpatient Medications  Medication Sig Dispense Refill   alendronate (FOSAMAX) 70 MG tablet Take 70 mg by mouth every Saturday.     celecoxib (CELEBREX) 200 MG capsule Take 200 mg by mouth 2 (two) times daily.     HYDROcodone-acetaminophen ([place])  10-325 MG tablet Take 1 tablet by mouth 3 (three) times daily.     hydrOXYzine (VISTARIL) 25 MG capsule Take 25-50 mg by mouth at bedtime.     rosuvastatin (CRESTOR) 5 MG tablet Take 5 mg by mouth at bedtime.     triamcinolone cream (KENALOG) 0.1 % Apply 1 application  topically 2 (two) times daily as needed (eczema).     No current facility-administered medications for this visit.    Family History  Problem Relation Age of Onset   Colon cancer Mother 53       d. 65   Tuberculosis Father    Bladder Cancer Sister 67       bilateral urothelial   Bladder Cancer Brother 6       urothelial   Diabetes Maternal Aunt    Bladder Cancer Brother        urothelial   Kidney cancer Brother    Bladder Cancer Other        urothelial, d. 24 - niece   Schizophrenia Other    Colon cancer Other        dx  twice, under 65 and over 38; nephew   Testicular cancer Other 32       grand-nephew    Social History   Socioeconomic History   Marital status: Widowed    Spouse name: Not on file   Number of children: 2   Years of education: Not on file   Highest education level: Not on file  Occupational History   Not on file  Tobacco Use   Smoking status: Former    Types: Cigarettes    Quit date: 05/28/1998    Years since quitting: 24.2   Smokeless tobacco: Never  Vaping Use   Vaping Use: Never used  Substance and Sexual Activity   Alcohol use: Never   Drug use: Never   Sexual activity: Not on file  Other Topics Concern   Not on file  Social History Narrative   Son passed away in 13-Sep-2019.   Social Determinants of Health   Financial Resource Strain: Not on file  Food Insecurity: Not on file  Transportation Needs: Not on file  Physical Activity: Not on file  Stress: Not on file  Social Connections: Not on file  Intimate Partner Violence: Not on file     REVIEW OF SYSTEMS:   [X]  denotes positive finding, [ ]  denotes negative finding Cardiac  Comments:  Chest pain or chest pressure:    Shortness of breath upon exertion:    Short of breath when lying flat:    Irregular heart rhythm:        Vascular    Pain in calf, thigh, or hip brought on by ambulation:    Pain in feet at night that wakes you up from your sleep:     Blood clot in your veins:    Leg swelling:         Pulmonary    Oxygen at home:    Productive cough:     Wheezing:         Neurologic    Sudden weakness in arms or legs:     Sudden numbness in arms or legs:     Sudden onset of difficulty speaking or slurred speech:    Temporary loss of vision in one eye:     Problems with dizziness:         Gastrointestinal    Blood in stool:     Vomited  blood:         Genitourinary    Burning when urinating:     Blood in urine:        Psychiatric    Major depression:         Hematologic    Bleeding problems:     Problems with blood clotting too easily:        Skin    Rashes or ulcers:        Constitutional    Fever or chills:      PHYSICAL EXAMINATION:  Today's Vitals   08/16/22 0852 08/16/22 0854  BP: 132/62 137/68  Pulse: 70   Resp: 20   Temp: (!) 97.5 F (36.4 C)   TempSrc: Temporal   SpO2: 97%   Weight: 200 lb (90.7 kg)   Height: 5' 0.5" (1.537 m)    Body mass index is 38.42 kg/m.   General:  WDWN in NAD; vital signs documented above Gait: Not observed HENT: WNL, normocephalic Pulmonary: normal non-labored breathing Cardiac: regular HR, with carotid bruits bilaterally Abdomen: soft, NT; aortic pulse is not palpable Skin: without rashes Vascular Exam/Pulses:  Right Left  Radial 2+ (normal) 2+ (normal)   Extremities: without open wounds Musculoskeletal: no muscle wasting or atrophy  Neurologic: A&O X 3; moving all extremities equally; speech is fluent/normal Psychiatric:  The pt has Normal affect.   Non-Invasive Vascular Imaging:   Carotid Duplex on 08/16/2022 Right:  40-59% ICA stenosis Left:  40-59% ICA stenosis   Previous Carotid duplex on 06/14/2021: Right: 40-59% ICA stenosis Left:   1-39% ICA stenosis    ASSESSMENT/PLAN:: 82 y.o. female here for follow up carotid artery stenosis and hx of AAA  Carotid stenosis -duplex today reveals slight bump to the 40-59% ICA stenosis on the left but in the low range.   -discussed s/s of stroke with pt and she understands should she develop any of these sx, she will go to the nearest ER or call 911. -pt will f/u in one year with carotid duplex -pt will call sooner should she have any issues. -continue statin.  She has not been on asa due to having skin bruising.  Discussed if she can tolerate asa, she would benefit.  She states she can and will start it back after her spinal stenosis surgery.    AAA -pt not having any new abdominal or back pain.  Discussed that we will check this again in a year when she comes for  her carotid duplex.  Discussed the risk of rupture is low but not zero.  She knows to call 911 if she develops new severe sudden abdominal or back pain.  She does have children.  We discussed they should be screened at some point given she and her brother have hx of AAA (brother's has been repaired).   Leontine Locket, [facility] [phone]  Clinic MD:  Scot Dock

## 2022-08-15 NOTE — Pre-Procedure Instructions (Signed)
Surgical Instructions    Your procedure is scheduled on Wednesday, March 27.  Report to [facility] Main Entrance "A" at 12:00 P.M., then check in with the Admitting office.  Call this number if you have problems the morning of surgery:  [phone]   If you have any questions prior to your surgery date call [phone]: Open Monday-Friday 8am-4pm If you experience any cold or flu symptoms such as cough, fever, chills, shortness of breath, etc. between now and your scheduled surgery, please notify us at the above number     Remember:  Do not eat or drink after midnight the night before your surgery    Take these medicines the morning of surgery with A SIP OF WATER:  HYDROcodone-acetaminophen ([place]) 10-325 MG tablet   As of today, STOP taking any Aspirin (unless otherwise instructed by your surgeon) Aleve, Naproxen, Ibuprofen, Motrin, Advil, Goody's, BC's, all herbal medications, fish oil, and, celecoxib (CELEBREX), all vitamins.          [place] is not responsible for any belongings or valuables.    Do NOT Smoke (Tobacco/Vaping)  24 hours prior to your procedure  If you use a CPAP at night, you may bring your mask for your overnight stay.   Contacts, glasses, hearing aids, dentures or partials may not be worn into surgery, please bring cases for these belongings   For patients admitted to the hospital, discharge time will be determined by your treatment team.   Patients discharged the day of surgery will not be allowed to drive home, and someone needs to stay with them for 24 hours.   SURGICAL WAITING ROOM VISITATION Patients having surgery or a procedure may have no more than 2 support people in the waiting area - these visitors may rotate.   Children under the age of 15 must have an adult with them who is not the patient. If the patient needs to stay at the hospital during part of their recovery, the visitor guidelines for inpatient rooms apply. Pre-op nurse will  coordinate an appropriate time for 1 support person to accompany patient in pre-op.  This support person may not rotate.   Please refer to [email or web address] for the visitor guidelines for Inpatients (after your surgery is over and you are in a regular room).    Special instructions:    Oral Hygiene is also important to reduce your risk of infection.  Remember - BRUSH YOUR TEETH THE MORNING OF SURGERY WITH YOUR REGULAR TOOTHPASTE   [place]- Preparing For Surgery  Before surgery, you can play an important role. Because skin is not sterile, your skin needs to be as free of germs as possible. You can reduce the number of germs on your skin by washing with CHG (chlorahexidine gluconate) Soap before surgery.  CHG is an antiseptic cleaner which kills germs and bonds with the skin to continue killing germs even after washing.     Please do not use if you have an allergy to CHG or antibacterial soaps. If your skin becomes reddened/irritated stop using the CHG.  Do not shave (including legs and underarms) for at least 48 hours prior to first CHG shower. It is OK to shave your face.  Please follow these instructions carefully.     Shower the NIGHT BEFORE SURGERY and the MORNING OF SURGERY with CHG Soap.   If you chose to wash your hair, wash your hair first as usual with your normal shampoo. After you shampoo, rinse your hair and body  thoroughly to remove the shampoo.  Then [employer] and genitals (private parts) with your normal soap and rinse thoroughly to remove soap.  After that Use CHG Soap as you would any other liquid soap. You can apply CHG directly to the skin and wash gently with a scrungie or a clean washcloth.   Apply the CHG Soap to your body ONLY FROM THE NECK DOWN.  Do not use on open wounds or open sores. Avoid contact with your eyes, ears, mouth and genitals (private parts). Wash Face and genitals (private parts)  with your normal  soap.   Wash thoroughly, paying special attention to the area where your surgery will be performed.  Thoroughly rinse your body with warm water from the neck down.  DO NOT shower/wash with your normal soap after using and rinsing off the CHG Soap.  Pat yourself dry with a CLEAN TOWEL.  Wear CLEAN PAJAMAS to bed the night before surgery  Place CLEAN SHEETS on your bed the night before your surgery  DO NOT SLEEP WITH PETS.   Day of Surgery:  Take a shower with CHG soap. Wear Clean/Comfortable clothing the morning of surgery Do not wear jewelry or makeup. Do not wear lotions, powders, perfumes/cologne or deodorant. Do not shave 48 hours prior to surgery.  Men may shave face and neck. Do not bring valuables to the hospital. Do not wear nail polish, gel polish, artificial nails, or any other type of covering on natural nails (fingers and toes) If you have artificial nails or gel coating that need to be removed by a nail salon, please have this removed prior to surgery. Artificial nails or gel coating may interfere with anesthesia's ability to adequately monitor your vital signs.  Remember to brush your teeth WITH YOUR REGULAR TOOTHPASTE.    If you received a COVID test during your pre-op visit, it is requested that you wear a mask when out in public, stay away from anyone that may not be feeling well, and notify your surgeon if you develop symptoms. If you have been in contact with anyone that has tested positive in the last 10 days, please notify your surgeon.    Please read over the following fact sheets that you were given.

## 2022-08-16 ENCOUNTER — Other Ambulatory Visit: Payer: Self-pay

## 2022-08-16 ENCOUNTER — Encounter (HOSPITAL_COMMUNITY): Payer: Self-pay

## 2022-08-16 ENCOUNTER — Ambulatory Visit (HOSPITAL_COMMUNITY)
Admission: RE | Admit: 2022-08-16 | Discharge: 2022-08-16 | Disposition: A | Discharge status: Home / Self Care | Payer: Medicare HMO | Source: Ambulatory Visit | Attending: Physician Assistant | Admitting: Physician Assistant

## 2022-08-16 ENCOUNTER — Encounter (HOSPITAL_COMMUNITY)
Admission: RE | Admit: 2022-08-16 | Discharge: 2022-08-16 | Disposition: A | Discharge status: Home / Self Care | Payer: Medicare HMO | Source: Ambulatory Visit | Attending: Neurological Surgery | Admitting: Neurological Surgery

## 2022-08-16 ENCOUNTER — Ambulatory Visit: Payer: Medicare HMO | Admitting: Physician Assistant

## 2022-08-16 VITALS — BP 159/55 | HR 70 | Temp 98.0°F | Resp 19 | Ht 60.2 in | Wt 200.0 lb

## 2022-08-16 VITALS — BP 137/68 | HR 70 | Temp 97.5°F | Resp 20 | Ht 60.5 in | Wt 200.0 lb

## 2022-08-16 DIAGNOSIS — I6523 Occlusion and stenosis of bilateral carotid arteries: Secondary | ICD-10-CM | POA: Diagnosis not present

## 2022-08-16 DIAGNOSIS — I77811 Abdominal aortic ectasia: Secondary | ICD-10-CM | POA: Diagnosis not present

## 2022-08-16 DIAGNOSIS — Z01818 Encounter for other preprocedural examination: Secondary | ICD-10-CM | POA: Diagnosis not present

## 2022-08-16 HISTORY — DX: Chronic obstructive pulmonary disease, unspecified: J44.9

## 2022-08-16 LAB — BASIC METABOLIC PANEL
Anion gap: 7 (ref 5–15)
BUN: 11 mg/dL (ref 8–23)
CO2: 30 mmol/L (ref 22–32)
Calcium: 9.3 mg/dL (ref 8.9–10.3)
Chloride: 102 mmol/L (ref 98–111)
Creatinine, Ser: 0.71 mg/dL (ref 0.44–1.00)
GFR, Estimated: >60 mL/min (ref >60)
Glucose, Bld: 111 mg/dL — ABNORMAL HIGH (ref 70–99)
Potassium: 4 mmol/L (ref 3.5–5.1)
Sodium: 139 mmol/L (ref 135–145)

## 2022-08-16 LAB — SURGICAL PCR SCREEN
MRSA, PCR: NEGATIVE
Staphylococcus aureus: NEGATIVE

## 2022-08-16 LAB — CBC
HCT: 38.3 % (ref 36.0–46.0)
Hemoglobin: 12.7 g/dL (ref 12.0–15.0)
MCH: 32.8 pg (ref 26.0–34.0)
MCHC: 33.2 g/dL (ref 30.0–36.0)
MCV: 99 fL (ref 80.0–100.0)
Platelets: 211 10*3/uL (ref 150–400)
RBC: 3.87 MIL/uL (ref 3.87–5.11)
RDW: 13 % (ref 11.5–15.5)
WBC: 6.6 10*3/uL (ref 4.0–10.5)
nRBC: 0 % (ref 0.0–0.2)

## 2022-08-16 LAB — PROTIME-INR
INR: 1 (ref 0.8–1.2)
Prothrombin Time: 12.8 seconds (ref 11.4–15.2)

## 2022-08-16 NOTE — Progress Notes (Signed)
PCP - Charlott Holler, NP Cardiologist - Denies  PPM/ICD - Denies  Chest x-ray - n/a EKG - 06/18/2022 Stress Test -  ECHO -  Cardiac Cath -   Sleep Study - Denies CPAP - Denies  Non-diabetic Last dose of GLP1 agonist-  n//a GLP1 instructions: n/a  Blood Thinner Instructions:Denies Aspirin Instructions:Denies  ERAS Protcol -No, NPO PRE-SURGERY Ensure or G2- No  COVID TEST- n/a   Anesthesia review No:   Patient denies shortness of breath, fever, cough and chest pain at PAT appointment   All instructions explained to the patient, with a verbal understanding of the material. Patient agrees to go over the instructions while at home for a better understanding. Patient also instructed to self quarantine after being tested for COVID-19. The opportunity to ask questions was provided.

## 2022-08-21 DIAGNOSIS — M5416 Radiculopathy, lumbar region: Secondary | ICD-10-CM | POA: Diagnosis not present

## 2022-08-21 DIAGNOSIS — M961 Postlaminectomy syndrome, not elsewhere classified: Secondary | ICD-10-CM | POA: Diagnosis not present

## 2022-08-21 DIAGNOSIS — M47816 Spondylosis without myelopathy or radiculopathy, lumbar region: Secondary | ICD-10-CM | POA: Diagnosis not present

## 2022-08-21 DIAGNOSIS — G894 Chronic pain syndrome: Secondary | ICD-10-CM | POA: Diagnosis not present

## 2022-08-21 DIAGNOSIS — Z79891 Long term (current) use of opiate analgesic: Secondary | ICD-10-CM | POA: Diagnosis not present

## 2022-08-21 DIAGNOSIS — E669 Obesity, unspecified: Secondary | ICD-10-CM | POA: Diagnosis not present

## 2022-08-21 DIAGNOSIS — Z1389 Encounter for screening for other disorder: Secondary | ICD-10-CM | POA: Diagnosis not present

## 2022-08-21 DIAGNOSIS — M5136 Other intervertebral disc degeneration, lumbar region: Secondary | ICD-10-CM | POA: Diagnosis not present

## 2022-08-22 ENCOUNTER — Ambulatory Visit (HOSPITAL_COMMUNITY): Payer: Medicare HMO | Admitting: Certified Registered Nurse Anesthetist

## 2022-08-22 ENCOUNTER — Ambulatory Visit (HOSPITAL_BASED_OUTPATIENT_CLINIC_OR_DEPARTMENT_OTHER): Payer: Medicare HMO | Admitting: Certified Registered Nurse Anesthetist

## 2022-08-22 ENCOUNTER — Other Ambulatory Visit: Payer: Self-pay

## 2022-08-22 ENCOUNTER — Ambulatory Visit (HOSPITAL_COMMUNITY): Payer: Medicare HMO

## 2022-08-22 ENCOUNTER — Ambulatory Visit (HOSPITAL_COMMUNITY)
Admission: RE | Disposition: A | Discharge status: Home / Self Care | Payer: Self-pay | Source: Home / Self Care | Attending: Neurological Surgery

## 2022-08-22 ENCOUNTER — Encounter (HOSPITAL_COMMUNITY): Payer: Self-pay | Admitting: Neurological Surgery

## 2022-08-22 ENCOUNTER — Observation Stay (HOSPITAL_COMMUNITY)
Admission: RE | Admit: 2022-08-22 | Discharge: 2022-08-23 | Disposition: A | Discharge status: Home / Self Care | Payer: Medicare HMO | Attending: Neurological Surgery | Admitting: Neurological Surgery

## 2022-08-22 DIAGNOSIS — Z87891 Personal history of nicotine dependence: Secondary | ICD-10-CM | POA: Diagnosis not present

## 2022-08-22 DIAGNOSIS — M5416 Radiculopathy, lumbar region: Secondary | ICD-10-CM | POA: Diagnosis not present

## 2022-08-22 DIAGNOSIS — Z9889 Other specified postprocedural states: Secondary | ICD-10-CM

## 2022-08-22 DIAGNOSIS — M48061 Spinal stenosis, lumbar region without neurogenic claudication: Principal | ICD-10-CM | POA: Insufficient documentation

## 2022-08-22 DIAGNOSIS — J449 Chronic obstructive pulmonary disease, unspecified: Secondary | ICD-10-CM

## 2022-08-22 DIAGNOSIS — Z79899 Other long term (current) drug therapy: Secondary | ICD-10-CM | POA: Diagnosis not present

## 2022-08-22 DIAGNOSIS — Z981 Arthrodesis status: Secondary | ICD-10-CM | POA: Diagnosis not present

## 2022-08-22 DIAGNOSIS — Z85828 Personal history of other malignant neoplasm of skin: Secondary | ICD-10-CM | POA: Diagnosis not present

## 2022-08-22 DIAGNOSIS — Z96651 Presence of right artificial knee joint: Secondary | ICD-10-CM | POA: Insufficient documentation

## 2022-08-22 HISTORY — PX: LUMBAR LAMINECTOMY/DECOMPRESSION MICRODISCECTOMY: SHX5026

## 2022-08-22 SURGERY — LUMBAR LAMINECTOMY/DECOMPRESSION MICRODISCECTOMY 1 LEVEL
Anesthesia: General | Site: Back

## 2022-08-22 MED ORDER — LIDOCAINE 2% (20 MG/ML) 5 ML SYRINGE
INTRAMUSCULAR | Status: DC | PRN
  Administered 2022-08-22: 100 mg via INTRAVENOUS

## 2022-08-22 MED ORDER — SUGAMMADEX SODIUM 200 MG/2ML IV SOLN
INTRAVENOUS | Status: DC | PRN
  Administered 2022-08-22: 400 mg via INTRAVENOUS

## 2022-08-22 MED ORDER — METHOCARBAMOL 500 MG PO TABS
500.0000 mg | ORAL_TABLET | Freq: Four times a day (QID) | ORAL | Status: DC | PRN
  Administered 2022-08-22 – 2022-08-23 (×3): 500 mg via ORAL
  Filled 2022-08-22 (×3): qty 1

## 2022-08-22 MED ORDER — FENTANYL CITRATE (PF) 250 MCG/5ML IJ SOLN
INTRAMUSCULAR | Status: DC | PRN
  Administered 2022-08-22: 100 ug via INTRAVENOUS

## 2022-08-22 MED ORDER — PHENYLEPHRINE 80 MCG/ML (10ML) SYRINGE FOR IV PUSH (FOR BLOOD PRESSURE SUPPORT)
PREFILLED_SYRINGE | INTRAVENOUS | Status: AC
  Filled 2022-08-22: qty 50

## 2022-08-22 MED ORDER — PHENOL 1.4 % MT LIQD: 1.0000 | OROMUCOSAL | Status: DC | PRN

## 2022-08-22 MED ORDER — FENTANYL CITRATE (PF) 100 MCG/2ML IJ SOLN
INTRAMUSCULAR | Status: AC
  Filled 2022-08-22: qty 2

## 2022-08-22 MED ORDER — ONDANSETRON HCL 4 MG/2ML IJ SOLN
INTRAMUSCULAR | Status: AC
  Filled 2022-08-22: qty 4

## 2022-08-22 MED ORDER — MORPHINE SULFATE (PF) 2 MG/ML IV SOLN: 2.0000 mg | INTRAVENOUS | Status: DC | PRN

## 2022-08-22 MED ORDER — CHLORHEXIDINE GLUCONATE CLOTH 2 % EX PADS: 6.0000 | MEDICATED_PAD | Freq: Once | CUTANEOUS | Status: DC

## 2022-08-22 MED ORDER — EPINEPHRINE 1 MG/10ML IJ SOSY
PREFILLED_SYRINGE | INTRAMUSCULAR | Status: AC
  Filled 2022-08-22: qty 10

## 2022-08-22 MED ORDER — DEXAMETHASONE SODIUM PHOSPHATE 4 MG/ML IJ SOLN: 4.0000 mg | Freq: Four times a day (QID) | INTRAMUSCULAR | Status: DC

## 2022-08-22 MED ORDER — ACETAMINOPHEN 500 MG PO TABS: 1000.0000 mg | ORAL_TABLET | Freq: Once | ORAL | Status: DC

## 2022-08-22 MED ORDER — PHENYLEPHRINE HCL-NACL 20-0.9 MG/250ML-% IV SOLN
INTRAVENOUS | Status: DC | PRN
  Administered 2022-08-22: 30 ug/min via INTRAVENOUS

## 2022-08-22 MED ORDER — CHLORHEXIDINE GLUCONATE 0.12 % MT SOLN
15.0000 mL | Freq: Once | OROMUCOSAL | Status: AC
  Administered 2022-08-22: 15 mL via OROMUCOSAL
  Filled 2022-08-22: qty 15

## 2022-08-22 MED ORDER — ACETAMINOPHEN 650 MG RE SUPP: 650.0000 mg | RECTAL | Status: DC | PRN

## 2022-08-22 MED ORDER — ONDANSETRON HCL 4 MG/2ML IJ SOLN: 4.0000 mg | Freq: Four times a day (QID) | INTRAMUSCULAR | Status: DC | PRN

## 2022-08-22 MED ORDER — CELECOXIB 200 MG PO CAPS
200.0000 mg | ORAL_CAPSULE | Freq: Two times a day (BID) | ORAL | Status: DC
  Administered 2022-08-22 – 2022-08-23 (×2): 200 mg via ORAL
  Filled 2022-08-22 (×2): qty 1

## 2022-08-22 MED ORDER — 0.9 % SODIUM CHLORIDE (POUR BTL) OPTIME
TOPICAL | Status: DC | PRN
  Administered 2022-08-22: 1000 mL

## 2022-08-22 MED ORDER — FENTANYL CITRATE (PF) 100 MCG/2ML IJ SOLN
25.0000 ug | INTRAMUSCULAR | Status: DC | PRN
  Administered 2022-08-22: 50 ug via INTRAVENOUS
  Administered 2022-08-22 (×4): 25 ug via INTRAVENOUS

## 2022-08-22 MED ORDER — SENNA 8.6 MG PO TABS
1.0000 | ORAL_TABLET | Freq: Two times a day (BID) | ORAL | Status: DC
  Administered 2022-08-22 – 2022-08-23 (×2): 8.6 mg via ORAL
  Filled 2022-08-22 (×2): qty 1

## 2022-08-22 MED ORDER — VANCOMYCIN HCL IN DEXTROSE 1-5 GM/200ML-% IV SOLN
1000.0000 mg | INTRAVENOUS | Status: AC
  Administered 2022-08-22: 1000 mg via INTRAVENOUS
  Filled 2022-08-22: qty 200

## 2022-08-22 MED ORDER — THROMBIN 5000 UNITS EX SOLR: OROMUCOSAL | Status: DC | PRN

## 2022-08-22 MED ORDER — SODIUM CHLORIDE 0.9 % IV SOLN: 250.0000 mL | INTRAVENOUS | Status: DC

## 2022-08-22 MED ORDER — ROCURONIUM BROMIDE 10 MG/ML (PF) SYRINGE
PREFILLED_SYRINGE | INTRAVENOUS | Status: AC
  Filled 2022-08-22: qty 30

## 2022-08-22 MED ORDER — EPHEDRINE SULFATE-NACL 50-0.9 MG/10ML-% IV SOSY
PREFILLED_SYRINGE | INTRAVENOUS | Status: DC | PRN
  Administered 2022-08-22: 5 mg via INTRAVENOUS

## 2022-08-22 MED ORDER — THROMBIN 5000 UNITS EX SOLR
CUTANEOUS | Status: AC
  Filled 2022-08-22: qty 5000

## 2022-08-22 MED ORDER — ORAL CARE MOUTH RINSE: 15.0000 mL | Freq: Once | OROMUCOSAL | Status: AC

## 2022-08-22 MED ORDER — PHENYLEPHRINE 80 MCG/ML (10ML) SYRINGE FOR IV PUSH (FOR BLOOD PRESSURE SUPPORT)
PREFILLED_SYRINGE | INTRAVENOUS | Status: DC | PRN
  Administered 2022-08-22 (×3): 160 ug via INTRAVENOUS

## 2022-08-22 MED ORDER — LACTATED RINGERS IV SOLN: INTRAVENOUS | Status: DC

## 2022-08-22 MED ORDER — BUPIVACAINE HCL (PF) 0.25 % IJ SOLN
INTRAMUSCULAR | Status: DC | PRN
  Administered 2022-08-22: 10 mL

## 2022-08-22 MED ORDER — SODIUM CHLORIDE 0.9% FLUSH
3.0000 mL | Freq: Two times a day (BID) | INTRAVENOUS | Status: DC
  Administered 2022-08-22: 3 mL via INTRAVENOUS

## 2022-08-22 MED ORDER — AMISULPRIDE (ANTIEMETIC) 5 MG/2ML IV SOLN: 10.0000 mg | Freq: Once | INTRAVENOUS | Status: DC | PRN

## 2022-08-22 MED ORDER — ACETAMINOPHEN 500 MG PO TABS
1000.0000 mg | ORAL_TABLET | ORAL | Status: AC
  Administered 2022-08-22: 1000 mg via ORAL
  Filled 2022-08-22: qty 2

## 2022-08-22 MED ORDER — DEXAMETHASONE 4 MG PO TABS
4.0000 mg | ORAL_TABLET | Freq: Four times a day (QID) | ORAL | Status: DC
  Administered 2022-08-22 – 2022-08-23 (×3): 4 mg via ORAL
  Filled 2022-08-22 (×3): qty 1

## 2022-08-22 MED ORDER — HYDROXYZINE PAMOATE 25 MG PO CAPS: 25.0000 mg | ORAL_CAPSULE | Freq: Every day | ORAL | Status: DC

## 2022-08-22 MED ORDER — HYDROCODONE-ACETAMINOPHEN 10-325 MG PO TABS
1.0000 | ORAL_TABLET | Freq: Three times a day (TID) | ORAL | Status: DC
  Administered 2022-08-22 – 2022-08-23 (×3): 1 via ORAL
  Filled 2022-08-22 (×3): qty 1

## 2022-08-22 MED ORDER — ROCURONIUM BROMIDE 10 MG/ML (PF) SYRINGE
PREFILLED_SYRINGE | INTRAVENOUS | Status: DC | PRN
  Administered 2022-08-22: 60 mg via INTRAVENOUS
  Administered 2022-08-22: 20 mg via INTRAVENOUS

## 2022-08-22 MED ORDER — ACETAMINOPHEN 325 MG PO TABS
650.0000 mg | ORAL_TABLET | ORAL | Status: DC | PRN
  Administered 2022-08-23: 650 mg via ORAL
  Filled 2022-08-22: qty 2

## 2022-08-22 MED ORDER — SODIUM CHLORIDE 0.9% FLUSH: 3.0000 mL | INTRAVENOUS | Status: DC | PRN

## 2022-08-22 MED ORDER — LIDOCAINE 2% (20 MG/ML) 5 ML SYRINGE
INTRAMUSCULAR | Status: AC
  Filled 2022-08-22: qty 15

## 2022-08-22 MED ORDER — GABAPENTIN 300 MG PO CAPS
300.0000 mg | ORAL_CAPSULE | ORAL | Status: AC
  Administered 2022-08-22: 300 mg via ORAL
  Filled 2022-08-22: qty 1

## 2022-08-22 MED ORDER — HYDROXYZINE HCL 25 MG PO TABS
25.0000 mg | ORAL_TABLET | Freq: Every day | ORAL | Status: DC
  Administered 2022-08-22: 25 mg via ORAL
  Filled 2022-08-22: qty 2

## 2022-08-22 MED ORDER — METHOCARBAMOL 1000 MG/10ML IJ SOLN: 500.0000 mg | Freq: Four times a day (QID) | INTRAVENOUS | Status: DC | PRN

## 2022-08-22 MED ORDER — ONDANSETRON HCL 4 MG/2ML IJ SOLN
INTRAMUSCULAR | Status: DC | PRN
  Administered 2022-08-22: 4 mg via INTRAVENOUS

## 2022-08-22 MED ORDER — EPHEDRINE 5 MG/ML INJ
INTRAVENOUS | Status: AC
  Filled 2022-08-22: qty 5

## 2022-08-22 MED ORDER — PROMETHAZINE HCL 25 MG/ML IJ SOLN: 6.2500 mg | INTRAMUSCULAR | Status: DC | PRN

## 2022-08-22 MED ORDER — METHOCARBAMOL 500 MG PO TABS
ORAL_TABLET | ORAL | Status: AC
  Filled 2022-08-22: qty 1

## 2022-08-22 MED ORDER — MENTHOL 3 MG MT LOZG: 1.0000 | LOZENGE | OROMUCOSAL | Status: DC | PRN

## 2022-08-22 MED ORDER — FENTANYL CITRATE (PF) 250 MCG/5ML IJ SOLN
INTRAMUSCULAR | Status: AC
  Filled 2022-08-22: qty 5

## 2022-08-22 MED ORDER — THROMBIN (RECOMBINANT) 5000 UNITS EX SOLR: CUTANEOUS | Status: DC | PRN

## 2022-08-22 MED ORDER — POTASSIUM CHLORIDE IN NACL 20-0.9 MEQ/L-% IV SOLN: INTRAVENOUS | Status: DC

## 2022-08-22 MED ORDER — DEXAMETHASONE SODIUM PHOSPHATE 10 MG/ML IJ SOLN
INTRAMUSCULAR | Status: DC | PRN
  Administered 2022-08-22: 5 mg via INTRAVENOUS

## 2022-08-22 MED ORDER — THROMBIN 5000 UNITS EX SOLR
CUTANEOUS | Status: AC
  Filled 2022-08-22: qty 10000

## 2022-08-22 MED ORDER — SUGAMMADEX SODIUM 500 MG/5ML IV SOLN
INTRAVENOUS | Status: AC
  Filled 2022-08-22: qty 5

## 2022-08-22 MED ORDER — PROPOFOL 10 MG/ML IV BOLUS
INTRAVENOUS | Status: DC | PRN
  Administered 2022-08-22: 130 mg via INTRAVENOUS

## 2022-08-22 MED ORDER — METHOCARBAMOL 500 MG PO TABS
500.0000 mg | ORAL_TABLET | Freq: Once | ORAL | Status: AC
  Administered 2022-08-22: 500 mg via ORAL

## 2022-08-22 MED ORDER — VANCOMYCIN HCL IN DEXTROSE 1-5 GM/200ML-% IV SOLN
1000.0000 mg | Freq: Once | INTRAVENOUS | Status: AC
  Administered 2022-08-23: 1000 mg via INTRAVENOUS
  Filled 2022-08-22: qty 200

## 2022-08-22 MED ORDER — BUPIVACAINE HCL (PF) 0.25 % IJ SOLN
INTRAMUSCULAR | Status: AC
  Filled 2022-08-22: qty 30

## 2022-08-22 MED ORDER — ONDANSETRON HCL 4 MG PO TABS: 4.0000 mg | ORAL_TABLET | Freq: Four times a day (QID) | ORAL | Status: DC | PRN

## 2022-08-22 MED ORDER — DEXAMETHASONE SODIUM PHOSPHATE 10 MG/ML IJ SOLN
INTRAMUSCULAR | Status: AC
  Filled 2022-08-22: qty 2

## 2022-08-22 SURGICAL SUPPLY — 44 items
APL SKNCLS STERI-STRIP NONHPOA (GAUZE/BANDAGES/DRESSINGS) ×1
BAG COUNTER SPONGE SURGICOUNT (BAG) ×1 IMPLANT
BAG SPNG CNTER NS LX DISP (BAG) ×1
BAND INSRT 18 STRL LF DISP RB (MISCELLANEOUS) ×2
BAND RUBBER #18 3X1/16 STRL (MISCELLANEOUS) ×2 IMPLANT
BENZOIN TINCTURE PRP APPL 2/3 (GAUZE/BANDAGES/DRESSINGS) ×1 IMPLANT
BUR CARBIDE MATCH 3.0 (BURR) ×1 IMPLANT
CANISTER SUCT 3000ML PPV (MISCELLANEOUS) ×1 IMPLANT
DRAPE LAPAROTOMY 100X72X124 (DRAPES) ×1 IMPLANT
DRAPE MICROSCOPE SLANT 54X150 (MISCELLANEOUS) ×1 IMPLANT
DRAPE SURG 17X23 STRL (DRAPES) ×1 IMPLANT
DRSG OPSITE POSTOP 4X6 (GAUZE/BANDAGES/DRESSINGS) IMPLANT
DURAPREP 26ML APPLICATOR (WOUND CARE) ×1 IMPLANT
ELECT REM PT RETURN 9FT ADLT (ELECTROSURGICAL) ×1
ELECTRODE REM PT RTRN 9FT ADLT (ELECTROSURGICAL) ×1 IMPLANT
GAUZE 4X4 16PLY ~~LOC~~+RFID DBL (SPONGE) IMPLANT
GLOVE BIO SURGEON STRL SZ7 (GLOVE) IMPLANT
GLOVE BIO SURGEON STRL SZ8 (GLOVE) ×1 IMPLANT
GLOVE BIOGEL PI IND STRL 7.0 (GLOVE) IMPLANT
GOWN STRL REUS W/ TWL LRG LVL3 (GOWN DISPOSABLE) IMPLANT
GOWN STRL REUS W/ TWL XL LVL3 (GOWN DISPOSABLE) ×1 IMPLANT
GOWN STRL REUS W/TWL 2XL LVL3 (GOWN DISPOSABLE) IMPLANT
GOWN STRL REUS W/TWL LRG LVL3 (GOWN DISPOSABLE) ×1
GOWN STRL REUS W/TWL XL LVL3 (GOWN DISPOSABLE) ×1
HEMOSTAT POWDER KIT SURGIFOAM (HEMOSTASIS) ×1 IMPLANT
KIT BASIN OR (CUSTOM PROCEDURE TRAY) ×1 IMPLANT
KIT TURNOVER KIT B (KITS) ×1 IMPLANT
NDL HYPO 25X1 1.5 SAFETY (NEEDLE) ×1 IMPLANT
NDL SPNL 20GX3.5 QUINCKE YW (NEEDLE) IMPLANT
NEEDLE HYPO 25X1 1.5 SAFETY (NEEDLE) ×1 IMPLANT
NEEDLE SPNL 20GX3.5 QUINCKE YW (NEEDLE) IMPLANT
NS IRRIG 1000ML POUR BTL (IV SOLUTION) ×1 IMPLANT
PACK LAMINECTOMY NEURO (CUSTOM PROCEDURE TRAY) ×1 IMPLANT
PAD ARMBOARD 7.5X6 YLW CONV (MISCELLANEOUS) ×3 IMPLANT
SOL ELECTROSURG ANTI STICK (MISCELLANEOUS) ×1
SOLUTION ELECTROSURG ANTI STCK (MISCELLANEOUS) ×1 IMPLANT
STRIP CLOSURE SKIN 1/2X4 (GAUZE/BANDAGES/DRESSINGS) ×1 IMPLANT
SUT VIC AB 0 CT1 18XCR BRD8 (SUTURE) ×1 IMPLANT
SUT VIC AB 0 CT1 8-18 (SUTURE) ×1
SUT VIC AB 2-0 CP2 18 (SUTURE) ×1 IMPLANT
SUT VIC AB 3-0 SH 8-18 (SUTURE) ×1 IMPLANT
TOWEL GREEN STERILE (TOWEL DISPOSABLE) ×1 IMPLANT
TOWEL GREEN STERILE FF (TOWEL DISPOSABLE) ×1 IMPLANT
WATER STERILE IRR 1000ML POUR (IV SOLUTION) ×1 IMPLANT

## 2022-08-22 NOTE — Transfer of Care (Signed)
Immediate Anesthesia Transfer of Care Note  Patient: Kaitlyn Martin  Procedure(s) Performed: Left Lumbar Two-Lumbar Three Decompressive Hemilaminectomy with sublaminar decompression (Back)  Patient Location: PACU  Anesthesia Type:General  Level of Consciousness: awake, alert , and patient cooperative  Airway & Oxygen Therapy: Patient connected to face mask oxygen  Post-op Assessment: Report given to RN and Post -op Vital signs reviewed and stable  Post vital signs: Reviewed and stable  Last Vitals:  Vitals Value Taken Time  BP 128/51 08/22/22 1532  Temp    Pulse 72 08/22/22 1534  Resp 15 08/22/22 1534  SpO2 100 % 08/22/22 1534  Vitals shown include unvalidated device data.  Last Pain:  Vitals:   08/22/22 1224  TempSrc:   PainSc: 0-No pain      Patients Stated Pain Goal: 0 ([id number])  Complications: No notable events documented.

## 2022-08-22 NOTE — Anesthesia Postprocedure Evaluation (Signed)
Anesthesia Post Note  Patient: Kaitlyn Martin  Procedure(s) Performed: Left Lumbar Two-Lumbar Three Decompressive Hemilaminectomy with sublaminar decompression (Back)     Patient location during evaluation: PACU Anesthesia Type: General Level of consciousness: sedated Pain management: pain level controlled Vital Signs Assessment: post-procedure vital signs reviewed and stable Respiratory status: spontaneous breathing and respiratory function stable Cardiovascular status: stable Postop Assessment: no apparent nausea or vomiting Anesthetic complications: no  No notable events documented.  Last Vitals:  Vitals:   08/22/22 1615 08/22/22 1630  BP: (!) 143/62 (!) 117/54  Pulse: 65 65  Resp: 18 19  Temp:    SpO2: 100% 100%    Last Pain:  Vitals:   08/22/22 1615  TempSrc:   PainSc: 7                  Raniah Karan DANIEL

## 2022-08-22 NOTE — Anesthesia Procedure Notes (Signed)
Procedure Name: Intubation Date/Time: 08/22/2022 2:02 PM  Performed by: Darletta Moll, CRNAPre-anesthesia Checklist: Patient identified, Emergency Drugs available, Suction available and Patient being monitored Patient Re-evaluated:Patient Re-evaluated prior to induction Oxygen Delivery Method: Circle system utilized Preoxygenation: Pre-oxygenation with 100% oxygen Induction Type: IV induction Ventilation: Mask ventilation without difficulty Laryngoscope Size: Mac and 4 Grade View: Grade I Tube type: Oral Tube size: 7.0 mm Number of attempts: 1 Airway Equipment and Method: Stylet and Oral airway Placement Confirmation: ETT inserted through vocal cords under direct vision, positive ETCO2 and breath sounds checked- equal and bilateral Secured at: 21 cm Tube secured with: Tape Dental Injury: Teeth and Oropharynx as per pre-operative assessment

## 2022-08-22 NOTE — Op Note (Signed)
08/22/2022  3:28 PM  PATIENT:  Kaitlyn Martin  82 y.o. 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 Imaging showed stenosis at L2-3 that was severe. The patient tried conservative measures without relief. Pain was debilitating. Recommended compressive laminectomy L2-3. Patient understood the risks, benefits, and alternatives and potential outcomes and wished to proceed.  PROCEDURE DETAILS: The patient was taken to the operating room and after induction of adequate generalized endotracheal anesthesia, the patient was rolled into the prone position on t chest rolls and all pressure points were padded. The lumbar region was cleaned and then prepped with DuraPrep and draped in the usual sterile fashion. 5 cc of local anesthesia was injected and then a dorsal midline incision was made and carried down to the lumbo sacral fascia. The fascia was opened and the paraspinous musculature was taken down in a subperiosteal fashion to expose L2-3 bilaterally. Intraoperative x-ray confirmed my level, and then I moved the spinous process of L2 and used a combination of the high-speed drill and the Kerrison punches to perform a hemilaminectomy, medial facetectomy, and foraminotomy at L2-3 bilaterally. The underlying yellow ligament was opened and removed in a piecemeal fashion to expose the underlying dura and exiting nerve root. I undercut the lateral recess and dissected down until I was medial to and distal to the pedicle. The nerve root  was well decompressed.  then palpated with a coronary dilator along the nerve root and into the foramen to assure adequate decompression. I felt no more compression of the nerve root. I irrigated with saline solution containing bacitracin. Achieved hemostasis with bipolar cautery, lined the dura with Gelfoam, and then closed the fascia with 0 Vicryl. I closed the subcutaneous tissues with 2-0 Vicryl and the subcuticular tissues with 3-0 Vicryl. The skin was then closed with benzoin and Steri-Strips. The drapes were removed, a sterile dressing was applied.  My nurse practitioner was involved in the exposure, safe retraction of the neural elements, and the closure. the patient was awakened from general anesthesia and transferred to the recovery room in stable condition. At the end of the procedure all sponge, needle and instrument counts were correct.    PLAN OF CARE: Admit for overnight observation  PATIENT DISPOSITION:  PACU - hemodynamically stable.   Delay start of Pharmacological VTE agent (>24hrs) due to surgical blood loss or risk of bleeding:  yes

## 2022-08-22 NOTE — H&P (Signed)
Subjective: Patient is a 82 y.o. female admitted for spinal stenosis with radiculopathy. Onset of symptoms was several months ago, gradually worsening since that time.  The pain is rated severe, and is located at the across the lower back and radiates to LLE. The pain is described as aching and occurs all day. The symptoms have been progressive. Symptoms are exacerbated by exercise, standing, and walking for more than a few minutes. MRI or CT showed stenosis L2-3   Past Medical History:  Diagnosis Date   AAA (abdominal aortic aneurysm) (HCC)    Aortic ectasia (HCC)    Cancer (HCC)    hx of skin cancer   Carotid artery stenosis    COPD (chronic obstructive pulmonary disease) (HCC)    Family history of bladder cancer    Family history of colon cancer    Family history of genetic mutation for hereditary nonpolyposis colorectal cancer (HNPCC)    Family history of kidney cancer    Lung nodule seen on imaging study 2004   [facility] per pt, she was told "it was the size of a grain of salt"   Osteoarthritis     Past Surgical History:  Procedure Laterality Date   ABDOMINAL HYSTERECTOMY  [place], 1973, 2014   CAROTID ENDARTERECTOMY Left 2015   CAROTID ENDARTERECTOMY Right 2014   [place] Bilateral 1960   CATARACT EXTRACTION Bilateral 2017   CHOLECYSTECTOMY  2016   TOTAL KNEE ARTHROPLASTY Right 2014    Prior to Admission medications   Medication Sig Start Date End Date Taking? Authorizing Provider  alendronate (FOSAMAX) 70 MG tablet Take 70 mg by mouth every Saturday. 04/18/21  Yes [provider]  celecoxib (CELEBREX) 200 MG capsule Take 200 mg by mouth 2 (two) times daily.   Yes [provider]  HYDROcodone-acetaminophen (NORCO) 10-325 MG tablet Take 1 tablet by mouth 3 (three) times daily. 06/07/22  Yes [provider]  hydrOXYzine (VISTARIL) 25 MG capsule Take 25-50 mg by mouth at bedtime.   Yes  [provider]  rosuvastatin (CRESTOR) 5 MG tablet Take 5 mg by mouth at bedtime. 06/05/22  Yes [provider]  triamcinolone cream (KENALOG) 0.1 % Apply 1 application  topically 2 (two) times daily as needed (eczema).   Yes [provider]   Allergies  Allergen Reactions   Other Shortness Of Breath and Rash    Quinolone antibiotics-short of breath, and rash   Penicillins Hives and Shortness Of Breath    Difficulty swallowing, difficulty breathing, hives   Codeine Nausea Only   Statins Other (See Comments)    Muscle pain/spasms     Social History   Tobacco Use   Smoking status: Former    Types: Cigarettes    Quit date: 05/28/1998    Years since quitting: 24.2    Passive exposure: Never   Smokeless tobacco: Never  Substance Use Topics   Alcohol use: Never    Family History  Problem Relation Age of Onset   Colon cancer Mother 92       d. 22   Tuberculosis Father    Bladder Cancer Sister 6       bilateral urothelial   Bladder Cancer Brother 32       urothelial   Diabetes Maternal Aunt    Bladder Cancer Brother        urothelial   Kidney cancer Brother    Bladder  Cancer Other        urothelial, d. 74 - niece   Schizophrenia Other    Colon cancer Other        dx twice, under 60 and over 67; nephew   Testicular cancer Other 24       grand-nephew     Review of Systems  Positive ROS: neg  All other systems have been reviewed and were otherwise negative with the exception of those mentioned in the HPI and as above.  Objective: Vital signs in last 24 hours: Temp:  [97.9 F (36.6 C)] 97.9 F (36.6 C) (03/27 1203) Pulse Rate:  [67] 67 (03/27 1203) Resp:  [17] 17 (03/27 1203) BP: (123)/(54) 123/54 (03/27 1203) SpO2:  [95 %] 95 % (03/27 1203) Weight:  [90.7 kg] 90.7 kg (03/27 1203)  General Appearance: Alert, cooperative, no distress, appears stated age Head: Normocephalic, without obvious abnormality, atraumatic Eyes: PERRL,  conjunctiva/corneas clear, EOM's intact    Neck: Supple, symmetrical, trachea midline Back: Symmetric, no curvature, ROM normal, no CVA tenderness Lungs:  respirations unlabored Heart: Regular rate and rhythm Abdomen: Soft, non-tender Extremities: Extremities normal, atraumatic, no cyanosis or edema Pulses: 2+ and symmetric all extremities Skin: Skin color, texture, turgor normal, no rashes or lesions  NEUROLOGIC:   Mental status: Alert and oriented x4,  no aphasia, good attention span, fund of knowledge, and memory Motor Exam - grossly normal Sensory Exam - grossly normal Reflexes: 1= Coordination - grossly normal Gait - grossly normal Balance - grossly normal Cranial Nerves: I: smell Not tested  II: visual acuity  OS: nl    OD: nl  II: visual fields Full to confrontation  II: pupils Equal, round, reactive to light  III,VII: ptosis None  III,IV,VI: extraocular muscles  Full ROM  V: mastication Normal  V: facial light touch sensation  Normal  V,VII: corneal reflex  Present  VII: facial muscle function - upper  Normal  VII: facial muscle function - lower Normal  VIII: hearing Not tested  IX: soft palate elevation  Normal  IX,X: gag reflex Present  XI: trapezius strength  5/5  XI: sternocleidomastoid strength 5/5  XI: neck flexion strength  5/5  XII: tongue strength  Normal    Data Review Lab Results  Component Value Date   WBC 6.6 08/16/2022   HGB 12.7 08/16/2022   HCT 38.3 08/16/2022   MCV 99.0 08/16/2022   PLT 211 08/16/2022   Lab Results  Component Value Date   NA 139 08/16/2022   K 4.0 08/16/2022   CL 102 08/16/2022   CO2 30 08/16/2022   BUN 11 08/16/2022   CREATININE 0.71 08/16/2022   GLUCOSE 111 (H) 08/16/2022   Lab Results  Component Value Date   INR 1.0 08/16/2022    Assessment/Plan:  Estimated body mass index is 37.79 kg/m as calculated from the following:   Height as of this encounter: 5\' 1"  (1.549 m).   Weight as of this encounter: 90.7  kg. Patient admitted for L L2-3 hemilam. Patient has failed a reasonable attempt at conservative therapy.  I explained the condition and procedure to the patient and answered any questions.  Patient wishes to proceed with procedure as planned. Understands risks/ benefits and typical outcomes of procedure.   Eustace Moore 08/22/2022 1:31 PM

## 2022-08-22 NOTE — Anesthesia Preprocedure Evaluation (Addendum)
Anesthesia Evaluation  Patient identified by MRN, date of birth, ID band Patient awake    Reviewed: Allergy & Precautions, NPO status , Patient's Chart, lab work & pertinent test results  History of Anesthesia Complications Negative for: history of anesthetic complications  Airway Mallampati: I  TM Distance: >3 FB Neck ROM: Full    Dental  (+) Edentulous Upper, Edentulous Lower, Dental Advisory Given   Pulmonary COPD, former smoker   Pulmonary exam normal        Cardiovascular + Peripheral Vascular Disease  Normal cardiovascular exam     Neuro/Psych negative neurological ROS     GI/Hepatic negative GI ROS, Neg liver ROS,,,  Endo/Other  Hypothyroidism    Renal/GU negative Renal ROS     Musculoskeletal negative musculoskeletal ROS (+)    Abdominal   Peds  Hematology negative hematology ROS (+)   Anesthesia Other Findings   Reproductive/Obstetrics                             Anesthesia Physical Anesthesia Plan  ASA: 3  Anesthesia Plan: General   Post-op Pain Management: Tylenol PO (pre-op)* and Toradol IV (intra-op)*   Induction: Intravenous  PONV Risk Score and Plan: 4 or greater and Ondansetron, Dexamethasone and Treatment may vary due to age or medical condition  Airway Management Planned: Oral ETT  Additional Equipment:   Intra-op Plan:   Post-operative Plan: Extubation in OR  Informed Consent: I have reviewed the patients History and Physical, chart, labs and discussed the procedure including the risks, benefits and alternatives for the proposed anesthesia with the patient or authorized representative who has indicated his/her understanding and acceptance.     Dental advisory given  Plan Discussed with: Anesthesiologist and CRNA  Anesthesia Plan Comments:        Anesthesia Quick Evaluation

## 2022-08-23 ENCOUNTER — Encounter (HOSPITAL_COMMUNITY): Payer: Self-pay | Admitting: Neurological Surgery

## 2022-08-23 DIAGNOSIS — Z96651 Presence of right artificial knee joint: Secondary | ICD-10-CM | POA: Diagnosis not present

## 2022-08-23 DIAGNOSIS — Z87891 Personal history of nicotine dependence: Secondary | ICD-10-CM | POA: Diagnosis not present

## 2022-08-23 DIAGNOSIS — M5416 Radiculopathy, lumbar region: Secondary | ICD-10-CM | POA: Diagnosis not present

## 2022-08-23 DIAGNOSIS — M48061 Spinal stenosis, lumbar region without neurogenic claudication: Secondary | ICD-10-CM | POA: Diagnosis not present

## 2022-08-23 DIAGNOSIS — Z85828 Personal history of other malignant neoplasm of skin: Secondary | ICD-10-CM | POA: Diagnosis not present

## 2022-08-23 DIAGNOSIS — Z79899 Other long term (current) drug therapy: Secondary | ICD-10-CM | POA: Diagnosis not present

## 2022-08-23 DIAGNOSIS — J449 Chronic obstructive pulmonary disease, unspecified: Secondary | ICD-10-CM | POA: Diagnosis not present

## 2022-08-23 MED ORDER — HYDROCODONE-ACETAMINOPHEN 10-325 MG PO TABS: 1.0000 | ORAL_TABLET | ORAL | 0 refills | Status: AC | PRN

## 2022-08-23 MED FILL — Thrombin For Soln 5000 Unit: CUTANEOUS | Qty: 2 | Status: AC

## 2022-08-23 NOTE — Evaluation (Signed)
Occupational Therapy Evaluation Patient Details Name: Kaitlyn Martin MRN: [id number] DOB: 1940/09/11 Today's Date: 08/23/2022   History of Present Illness 82 y/o female presenting with L2-3 stenosis with radiculopathy underwent DLL L2-3.   Clinical Impression   Patient evaluated by Occupational Therapy with no further acute OT needs identified. All education has been completed and the patient has no further questions. Prior to surgery, pt reports that she lives alone and is Mod I for ADL tasks and functional mobility. She has family and friend support at home. No follow up OT services or DME recommended. OT is signing off. Thank you for this referral.       Recommendations for follow up therapy are one component of a multi-disciplinary discharge planning process, led by the attending physician.  Recommendations may be updated based on patient status, additional functional criteria and insurance authorization.   Assistance Recommended at Discharge PRN  Patient can return home with the following Assist for transportation;Assistance with cooking/housework    Functional Status Assessment  Patient has had a recent decline in their functional status and demonstrates the ability to make significant improvements in function in a reasonable and predictable amount of time.  Equipment Recommendations  None recommended by OT       Precautions / Restrictions Precautions Precautions: Back Precaution Booklet Issued: Yes (comment) Precaution Comments: Provided handout with verbal education also Restrictions Weight Bearing Restrictions: No      Mobility Bed Mobility Overal bed mobility: Needs Assistance Bed Mobility: Rolling, Sidelying to Sit, Sit to Sidelying Rolling: Supervision Sidelying to sit: Supervision     Sit to sidelying: Supervision General bed mobility comments: Provided VC for log rolling technique. Used chair next to bed as bed rail to practice for home use. Pt was able to  complete bed mobility with less difficulty when completed a second time. Patient Response: Cooperative  Transfers Overall transfer level: Modified independent Equipment used: Straight cane   General transfer comment: VC provide during functional mobility regarding standing posture while maintaining back precautions      Balance Overall balance assessment: Mild deficits observed, not formally tested       ADL either performed or assessed with clinical judgement   ADL Overall ADL's : Modified independent;At baseline         Vision Baseline Vision/History: 1 Wears glasses (reading) Ability to See in Adequate Light: 0 Adequate Patient Visual Report: No change from baseline Vision Assessment?: No apparent visual deficits            Pertinent Vitals/Pain Pain Assessment Pain Assessment: 0-10 Pain Score: 2  Pain Location: back Pain Descriptors / Indicators: Operative site guarding, Sore Pain Intervention(s): Monitored during session, Repositioned     Hand Dominance Right   Extremity/Trunk Assessment Upper Extremity Assessment Upper Extremity Assessment: Overall WFL for tasks assessed   Lower Extremity Assessment Lower Extremity Assessment: Overall WFL for tasks assessed   Cervical / Trunk Assessment Cervical / Trunk Assessment: Back Surgery   Communication Communication Communication: No difficulties   Cognition Arousal/Alertness: Awake/alert Behavior During Therapy: WFL for tasks assessed/performed Overall Cognitive Status: Within Functional Limits for tasks assessed       General Comments  VSS on RA            Home Living Family/patient expects to be discharged to:: Private residence Living Arrangements: Alone Available Help at Discharge: Family;Available 24 hours/day Type of Home: Mobile home (double wide trailer) Home Access: Stairs to enter [employer] of Steps: 3-4 Entrance Stairs-Rails: Right Home  Layout: One level     Bathroom  Shower/Tub: Occupational psychologist: Handicapped height     Home Equipment: [place] - single point;Rollator (4 wheels);Rolling Walker (2 wheels);Adaptive equipment Adaptive Equipment: Tax inspector;Other (Comment) (pt made herself toilet tongs)        Prior Functioning/Environment Prior Level of Function : Independent/Modified Independent            OT Problem List: Decreased strength      OT Treatment/Interventions:   Self care management   OT Goals(Current goals can be found in the care plan section) Acute Rehab OT Goals Patient Stated Goal: to go home  OT Frequency:  1X visit       AM-PAC OT "6 Clicks" Daily Activity     Outcome Measure Help from another person eating meals?: None Help from another person taking care of personal grooming?: None Help from another person toileting, which includes using toliet, bedpan, or urinal?: None Help from another person bathing (including washing, rinsing, drying)?: None Help from another person to put on and taking off regular upper body clothing?: None Help from another person to put on and taking off regular lower body clothing?: None 6 Click Score: 24   End of Session Equipment Utilized During Treatment: Gait belt;Other (comment) [facility]) Nurse Communication: Mobility status  Activity Tolerance: Patient tolerated treatment well Patient left: in bed;with call bell/phone within reach  OT Visit Diagnosis: Unsteadiness on feet (R26.81)                Time: [id number] OT Time Calculation (min): 28 min Charges:  OT General Charges $OT Visit: 1 Visit OT Evaluation $OT Eval Moderate Complexity: 1 Mod OT Treatments $Self Care/Home Management : 8-22 mins  Ailene Ravel, OTR/L,CBIS  Supplemental OT - [facility] and [facility] Secure Chat Preferred    Samarrah Tranchina, Clarene Duke 08/23/2022, 12:26 PM

## 2022-08-23 NOTE — Discharge Summary (Signed)
Physician Discharge Summary  Patient ID: Kaitlyn Martin MRN: [id number] DOB/AGE: July 06, 1940 82 y.o.  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 The patient tolerated the procedure well and was taken to the recovery room and then to the floor in stable condition. The hospital course was routine. There were no complications. The wound remained clean dry and intact. Pt had appropriate back soreness. No complaints of leg pain or new N/T/W. The patient remained afebrile with stable vital signs, and tolerated a regular diet. The patient continued to increase activities, and pain was well controlled with oral pain medications.   Consults: None  Significant Diagnostic Studies:  Results for orders placed or performed during the hospital encounter of 08/16/22  Surgical pcr screen   Specimen: Nasal Mucosa; Nasal Swab  Result Value Ref Range   MRSA, PCR NEGATIVE NEGATIVE   Staphylococcus aureus NEGATIVE NEGATIVE  Basic metabolic panel  Result Value Ref Range   Sodium 139 135 - 145 mmol/L   Potassium 4.0 3.5 - 5.1 mmol/L   Chloride 102 98 - 111 mmol/L   CO2 30 22 - 32 mmol/L   Glucose, Bld 111 (H) 70 - 99 mg/dL   BUN 11 8 - 23 mg/dL   Creatinine, Ser 0.71 0.44 - 1.00 mg/dL   Calcium 9.3 8.9 - 10.3 mg/dL   GFR, Estimated >60 >60 mL/min   Anion gap 7 5 - 15  Protime-INR  Result Value Ref Range   Prothrombin Time 12.8 11.4 - 15.2 seconds   INR 1.0 0.8 - 1.2  CBC  Result Value Ref Range   WBC 6.6 4.0 - 10.5 K/uL   RBC 3.87 3.87 - 5.11 MIL/uL   Hemoglobin 12.7 12.0 - 15.0 g/dL   HCT 38.3 36.0 - 46.0 %   MCV 99.0 80.0 - 100.0 fL   MCH 32.8 26.0 - 34.0 pg   MCHC 33.2 30.0 - 36.0 g/dL   RDW 13.0 11.5 - 15.5 %   Platelets 211 150 - 400 K/uL    nRBC 0.0 0.0 - 0.2 %    DG Lumbar Spine 2-3 Views  Result Date: 08/22/2022 CLINICAL DATA:  Status post hemilaminectomy and sublaminar decompression at L2-3. EXAM: LUMBAR SPINE - 2 VIEW COMPARISON:  Preoperative x-ray 06/19/2021 FINDINGS: Two separate cross-table lateral intraoperative x-rays are submitted for review. There are pedicle screws with fixation rods at L3 through L5 with disc material at these levels. Severe disc height loss at L5-S1 with osteophytes. 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 Initial image has a needle superficial to the posterior elements at the L2-3 level. Subsequent surgical instruments along the posterior elements at the superior endplate of L3. Imaging was obtained to aid in treatment. Please correlate with surgical history. IMPRESSION: Intraoperative localization x-rays of the lumbar spine Electronically Signed   By: Jill Side M.D.   On: 08/22/2022 18:32   VAS US CAROTID  Result Date: 08/16/2022 Carotid Arterial Duplex Study Patient Name:  Kaitlyn Martin [facility]  Date of Exam:   08/16/2022 Medical Rec #: [id number]        Accession #:    [id number] Date of Birth: [birth year]        Patient Gender: F Patient Age:   43 years Exam Location:  [facility]  Procedure:      VAS US CAROTID Referring Phys: Dagoberto Ligas --------------------------------------------------------------------------------  Indications:       Bilateral carotid artery stenosis. Risk Factors:      Hypertension, hyperlipidemia, coronary artery disease. Other Factors:     History of bilateral carotid endarterectomies in 2014. Comparison Study:  prior 08/10/20 Performing Technologist: Archie Patten RVS  Examination Guidelines: A complete evaluation includes B-mode imaging, spectral Doppler, color Doppler, and power Doppler as needed of all accessible portions of each vessel. Bilateral testing is considered an integral part of a complete examination. Limited examinations for  reoccurring indications may be performed as noted.  Right Carotid Findings: +----------+--------+--------+--------+------------------+--------+           PSV cm/sEDV cm/sStenosisPlaque DescriptionComments +----------+--------+--------+--------+------------------+--------+ CCA Prox  129     21                                         +----------+--------+--------+--------+------------------+--------+ CCA Distal80      22              homogeneous                +----------+--------+--------+--------+------------------+--------+ ICA Prox  166     50      40-59%  homogeneous                +----------+--------+--------+--------+------------------+--------+ ICA Mid   163     41                                         +----------+--------+--------+--------+------------------+--------+ ICA Distal82      35                                         +----------+--------+--------+--------+------------------+--------+ ECA       99      9                                          +----------+--------+--------+--------+------------------+--------+ +----------+--------+-------+--------+-------------------+           PSV cm/sEDV cmsDescribeArm Pressure (mmHG) +----------+--------+-------+--------+-------------------+ Subclavian118                    170                 +----------+--------+-------+--------+-------------------+ +---------+--------+--+--------+--+--------+ VertebralPSV cm/s51EDV cm/s18Atypical +---------+--------+--+--------+--+--------+  Left Carotid Findings: +----------+--------+--------+--------+------------------+--------+           PSV cm/sEDV cm/sStenosisPlaque DescriptionComments +----------+--------+--------+--------+------------------+--------+ CCA Prox  95      19                                         +----------+--------+--------+--------+------------------+--------+ CCA Distal111     20              homogeneous                 +----------+--------+--------+--------+------------------+--------+ ICA Prox  156     41      40-59%  homogeneous                +----------+--------+--------+--------+------------------+--------+  ICA Distal189     43                                         +----------+--------+--------+--------+------------------+--------+ ECA       171     39                                         +----------+--------+--------+--------+------------------+--------+ +----------+--------+--------+--------+-------------------+           PSV cm/sEDV cm/sDescribeArm Pressure (mmHG) +----------+--------+--------+--------+-------------------+ Subclavian109                     180                 +----------+--------+--------+--------+-------------------+ +---------+--------+--+--------+--+---------+ VertebralPSV cm/s93EDV cm/s23Antegrade +---------+--------+--+--------+--+---------+   Summary: Right Carotid: Velocities in the right ICA are consistent with a 40-59%                stenosis. Left Carotid: Velocities in the left ICA are consistent with a 40-59% stenosis. Vertebrals: Left vertebral artery demonstrates antegrade flow. Right vertebral             artery demonstrates atypical flow. *See table(s) above for measurements and observations.  Electronically signed by Deitra Mayo MD on 08/16/2022 at 9:38:59 AM.    Final     Antibiotics:  Anti-infectives (From admission, onward)    Start     Dose/Rate Route Frequency Ordered Stop   08/23/22 0330  vancomycin (VANCOCIN) IVPB 1000 mg/200 mL premix        1,000 mg 200 mL/hr over 60 Minutes Intravenous  Once 08/22/22 1707 08/23/22 0432   08/22/22 1200  vancomycin (VANCOCIN) IVPB 1000 mg/200 mL premix        1,000 mg 200 mL/hr over 60 Minutes Intravenous On call to O.R. 08/22/22 1149 08/22/22 1344       Discharge Exam: Blood pressure 127/60, pulse 72, temperature 98.1 F (36.7 C), temperature source Oral, resp. rate 16,  height 5\' 1"  (1.549 m), weight 90.7 kg, SpO2 96 %. Neurologic: Grossly normal Dressing ry  Discharge Medications:   Allergies as of 08/23/2022       Reactions   Other Shortness Of Breath, Rash   Quinolone antibiotics-short of breath, and rash   Penicillins Hives, Shortness Of Breath   Difficulty swallowing, difficulty breathing, hives   Codeine Nausea Only   Statins Other (See Comments)   Muscle pain/spasms        Medication List     TAKE these medications    alendronate 70 MG tablet Commonly known as: FOSAMAX Take 70 mg by mouth every Saturday.   celecoxib 200 MG capsule Commonly known as: CELEBREX Take 200 mg by mouth 2 (two) times daily.   HYDROcodone-acetaminophen 10-325 MG tablet Commonly known as: NORCO Take 1 tablet by mouth every 4 (four) hours as needed. What changed:  when to take this reasons to take this   hydrOXYzine 25 MG capsule Commonly known as: VISTARIL Take 25-50 mg by mouth at bedtime.   rosuvastatin 5 MG tablet Commonly known as: CRESTOR Take 5 mg by mouth at bedtime.   triamcinolone cream 0.1 % Commonly known as: KENALOG Apply 1 application  topically 2 (two) times daily as needed (eczema).        Disposition: home   Final  Dx: lumbar laminectomy L2-3  Discharge Instructions      Remove dressing in 72 hours   Complete by: As directed    Call MD for:  difficulty breathing, headache or visual disturbances   Complete by: As directed    Call MD for:  persistant nausea and vomiting   Complete by: As directed    Call MD for:  redness, tenderness, or signs of infection (pain, swelling, redness, odor or green/yellow discharge around incision site)   Complete by: As directed    Call MD for:  severe uncontrolled pain   Complete by: As directed    Call MD for:  temperature >100.4   Complete by: As directed    Diet - low sodium heart healthy   Complete by: As directed    Increase activity slowly   Complete by: As directed          Follow-up Information     Eustace Moore, MD. Schedule an appointment as soon as possible for a visit in 2 week(s).   Specialty: Neurosurgery Contact information: [address] [place] [address] [phone]                  Signed: Eustace Moore 08/23/2022, 7:47 AM

## 2022-08-23 NOTE — Plan of Care (Signed)
  Problem: Education: Goal: Ability to verbalize activity precautions or restrictions will improve Outcome: Completed/Met Goal: Knowledge of the prescribed therapeutic regimen will improve Outcome: Completed/Met Goal: Understanding of discharge needs will improve Outcome: Completed/Met   Problem: Activity: Goal: Ability to avoid complications of mobility impairment will improve Outcome: Completed/Met Goal: Ability to tolerate increased activity will improve Outcome: Completed/Met Goal: Will remain free from falls Outcome: Completed/Met   Problem: Bowel/Gastric: Goal: Gastrointestinal status for postoperative course will improve Outcome: Completed/Met   Problem: Clinical Measurements: Goal: Ability to maintain clinical measurements within normal limits will improve Outcome: Completed/Met Goal: Postoperative complications will be avoided or minimized Outcome: Completed/Met Goal: Diagnostic test results will improve Outcome: Completed/Met   Problem: Pain Management: Goal: Pain level will decrease Outcome: Completed/Met   Problem: Skin Integrity: Goal: Will show signs of wound healing Outcome: Completed/Met   Problem: Health Behavior/Discharge Planning: Goal: Identification of resources available to assist in meeting health care needs will improve Outcome: Completed/Met   Problem: Bladder/Genitourinary: Goal: Urinary functional status for postoperative course will improve Outcome: Completed/Met Patient alert and oriented, void, ambulate. Surgical site clean and dry, d/c instructions explain and given all questions answered.

## 2022-09-18 DIAGNOSIS — Z1389 Encounter for screening for other disorder: Secondary | ICD-10-CM | POA: Diagnosis not present

## 2022-09-18 DIAGNOSIS — E669 Obesity, unspecified: Secondary | ICD-10-CM | POA: Diagnosis not present

## 2022-09-18 DIAGNOSIS — M47816 Spondylosis without myelopathy or radiculopathy, lumbar region: Secondary | ICD-10-CM | POA: Diagnosis not present

## 2022-09-18 DIAGNOSIS — M5416 Radiculopathy, lumbar region: Secondary | ICD-10-CM | POA: Diagnosis not present

## 2022-09-18 DIAGNOSIS — M5136 Other intervertebral disc degeneration, lumbar region: Secondary | ICD-10-CM | POA: Diagnosis not present

## 2022-09-18 DIAGNOSIS — Z79891 Long term (current) use of opiate analgesic: Secondary | ICD-10-CM | POA: Diagnosis not present

## 2022-09-18 DIAGNOSIS — M961 Postlaminectomy syndrome, not elsewhere classified: Secondary | ICD-10-CM | POA: Diagnosis not present

## 2022-09-18 DIAGNOSIS — G894 Chronic pain syndrome: Secondary | ICD-10-CM | POA: Diagnosis not present

## 2022-09-27 DIAGNOSIS — M1712 Unilateral primary osteoarthritis, left knee: Secondary | ICD-10-CM | POA: Diagnosis not present

## 2022-09-27 DIAGNOSIS — L309 Dermatitis, unspecified: Secondary | ICD-10-CM | POA: Diagnosis not present

## 2022-09-27 DIAGNOSIS — E669 Obesity, unspecified: Secondary | ICD-10-CM | POA: Diagnosis not present

## 2022-10-04 DIAGNOSIS — M5136 Other intervertebral disc degeneration, lumbar region: Secondary | ICD-10-CM | POA: Diagnosis not present

## 2022-10-04 DIAGNOSIS — Z6835 Body mass index (BMI) 35.0-35.9, adult: Secondary | ICD-10-CM | POA: Diagnosis not present

## 2022-10-17 DIAGNOSIS — Z1389 Encounter for screening for other disorder: Secondary | ICD-10-CM | POA: Diagnosis not present

## 2022-10-17 DIAGNOSIS — M47816 Spondylosis without myelopathy or radiculopathy, lumbar region: Secondary | ICD-10-CM | POA: Diagnosis not present

## 2022-10-17 DIAGNOSIS — G894 Chronic pain syndrome: Secondary | ICD-10-CM | POA: Diagnosis not present

## 2022-10-17 DIAGNOSIS — M961 Postlaminectomy syndrome, not elsewhere classified: Secondary | ICD-10-CM | POA: Diagnosis not present

## 2022-10-17 DIAGNOSIS — Z79891 Long term (current) use of opiate analgesic: Secondary | ICD-10-CM | POA: Diagnosis not present

## 2022-10-17 DIAGNOSIS — M5416 Radiculopathy, lumbar region: Secondary | ICD-10-CM | POA: Diagnosis not present

## 2022-10-17 DIAGNOSIS — E669 Obesity, unspecified: Secondary | ICD-10-CM | POA: Diagnosis not present

## 2022-10-17 DIAGNOSIS — M5136 Other intervertebral disc degeneration, lumbar region: Secondary | ICD-10-CM | POA: Diagnosis not present

## 2022-10-18 DIAGNOSIS — M625A2 Muscle wasting and atrophy, not elsewhere classified, back, lumbosacral: Secondary | ICD-10-CM | POA: Diagnosis not present

## 2022-10-18 DIAGNOSIS — M62552 Muscle wasting and atrophy, not elsewhere classified, left thigh: Secondary | ICD-10-CM | POA: Diagnosis not present

## 2022-10-18 DIAGNOSIS — M62551 Muscle wasting and atrophy, not elsewhere classified, right thigh: Secondary | ICD-10-CM | POA: Diagnosis not present

## 2022-10-18 DIAGNOSIS — M545 Low back pain, unspecified: Secondary | ICD-10-CM | POA: Diagnosis not present

## 2022-10-23 DIAGNOSIS — M625A2 Muscle wasting and atrophy, not elsewhere classified, back, lumbosacral: Secondary | ICD-10-CM | POA: Diagnosis not present

## 2022-10-23 DIAGNOSIS — M62551 Muscle wasting and atrophy, not elsewhere classified, right thigh: Secondary | ICD-10-CM | POA: Diagnosis not present

## 2022-10-23 DIAGNOSIS — M62552 Muscle wasting and atrophy, not elsewhere classified, left thigh: Secondary | ICD-10-CM | POA: Diagnosis not present

## 2022-10-23 DIAGNOSIS — M545 Low back pain, unspecified: Secondary | ICD-10-CM | POA: Diagnosis not present

## 2022-10-25 DIAGNOSIS — M625A2 Muscle wasting and atrophy, not elsewhere classified, back, lumbosacral: Secondary | ICD-10-CM | POA: Diagnosis not present

## 2022-10-25 DIAGNOSIS — M62551 Muscle wasting and atrophy, not elsewhere classified, right thigh: Secondary | ICD-10-CM | POA: Diagnosis not present

## 2022-10-25 DIAGNOSIS — M545 Low back pain, unspecified: Secondary | ICD-10-CM | POA: Diagnosis not present

## 2022-10-25 DIAGNOSIS — M62552 Muscle wasting and atrophy, not elsewhere classified, left thigh: Secondary | ICD-10-CM | POA: Diagnosis not present

## 2022-10-29 DIAGNOSIS — M62552 Muscle wasting and atrophy, not elsewhere classified, left thigh: Secondary | ICD-10-CM | POA: Diagnosis not present

## 2022-10-29 DIAGNOSIS — M625A2 Muscle wasting and atrophy, not elsewhere classified, back, lumbosacral: Secondary | ICD-10-CM | POA: Diagnosis not present

## 2022-10-29 DIAGNOSIS — M62551 Muscle wasting and atrophy, not elsewhere classified, right thigh: Secondary | ICD-10-CM | POA: Diagnosis not present

## 2022-10-29 DIAGNOSIS — M545 Low back pain, unspecified: Secondary | ICD-10-CM | POA: Diagnosis not present

## 2022-11-01 DIAGNOSIS — M62552 Muscle wasting and atrophy, not elsewhere classified, left thigh: Secondary | ICD-10-CM | POA: Diagnosis not present

## 2022-11-01 DIAGNOSIS — M545 Low back pain, unspecified: Secondary | ICD-10-CM | POA: Diagnosis not present

## 2022-11-01 DIAGNOSIS — M62551 Muscle wasting and atrophy, not elsewhere classified, right thigh: Secondary | ICD-10-CM | POA: Diagnosis not present

## 2022-11-01 DIAGNOSIS — M625A2 Muscle wasting and atrophy, not elsewhere classified, back, lumbosacral: Secondary | ICD-10-CM | POA: Diagnosis not present

## 2022-11-06 DIAGNOSIS — M62551 Muscle wasting and atrophy, not elsewhere classified, right thigh: Secondary | ICD-10-CM | POA: Diagnosis not present

## 2022-11-06 DIAGNOSIS — M625A2 Muscle wasting and atrophy, not elsewhere classified, back, lumbosacral: Secondary | ICD-10-CM | POA: Diagnosis not present

## 2022-11-06 DIAGNOSIS — M62552 Muscle wasting and atrophy, not elsewhere classified, left thigh: Secondary | ICD-10-CM | POA: Diagnosis not present

## 2022-11-06 DIAGNOSIS — M545 Low back pain, unspecified: Secondary | ICD-10-CM | POA: Diagnosis not present

## 2022-11-08 DIAGNOSIS — M625A2 Muscle wasting and atrophy, not elsewhere classified, back, lumbosacral: Secondary | ICD-10-CM | POA: Diagnosis not present

## 2022-11-08 DIAGNOSIS — M545 Low back pain, unspecified: Secondary | ICD-10-CM | POA: Diagnosis not present

## 2022-11-08 DIAGNOSIS — M62551 Muscle wasting and atrophy, not elsewhere classified, right thigh: Secondary | ICD-10-CM | POA: Diagnosis not present

## 2022-11-08 DIAGNOSIS — M62552 Muscle wasting and atrophy, not elsewhere classified, left thigh: Secondary | ICD-10-CM | POA: Diagnosis not present

## 2022-11-13 DIAGNOSIS — M625A2 Muscle wasting and atrophy, not elsewhere classified, back, lumbosacral: Secondary | ICD-10-CM | POA: Diagnosis not present

## 2022-11-13 DIAGNOSIS — M545 Low back pain, unspecified: Secondary | ICD-10-CM | POA: Diagnosis not present

## 2022-11-13 DIAGNOSIS — M62551 Muscle wasting and atrophy, not elsewhere classified, right thigh: Secondary | ICD-10-CM | POA: Diagnosis not present

## 2022-11-13 DIAGNOSIS — M62552 Muscle wasting and atrophy, not elsewhere classified, left thigh: Secondary | ICD-10-CM | POA: Diagnosis not present

## 2022-11-15 DIAGNOSIS — M625A2 Muscle wasting and atrophy, not elsewhere classified, back, lumbosacral: Secondary | ICD-10-CM | POA: Diagnosis not present

## 2022-11-15 DIAGNOSIS — M62552 Muscle wasting and atrophy, not elsewhere classified, left thigh: Secondary | ICD-10-CM | POA: Diagnosis not present

## 2022-11-15 DIAGNOSIS — M62551 Muscle wasting and atrophy, not elsewhere classified, right thigh: Secondary | ICD-10-CM | POA: Diagnosis not present

## 2022-11-15 DIAGNOSIS — M545 Low back pain, unspecified: Secondary | ICD-10-CM | POA: Diagnosis not present

## 2022-11-16 DIAGNOSIS — M961 Postlaminectomy syndrome, not elsewhere classified: Secondary | ICD-10-CM | POA: Diagnosis not present

## 2022-11-16 DIAGNOSIS — M5416 Radiculopathy, lumbar region: Secondary | ICD-10-CM | POA: Diagnosis not present

## 2022-11-16 DIAGNOSIS — M47816 Spondylosis without myelopathy or radiculopathy, lumbar region: Secondary | ICD-10-CM | POA: Diagnosis not present

## 2022-11-16 DIAGNOSIS — E669 Obesity, unspecified: Secondary | ICD-10-CM | POA: Diagnosis not present

## 2022-11-16 DIAGNOSIS — M5136 Other intervertebral disc degeneration, lumbar region: Secondary | ICD-10-CM | POA: Diagnosis not present

## 2022-11-16 DIAGNOSIS — Z1389 Encounter for screening for other disorder: Secondary | ICD-10-CM | POA: Diagnosis not present

## 2022-11-16 DIAGNOSIS — Z79891 Long term (current) use of opiate analgesic: Secondary | ICD-10-CM | POA: Diagnosis not present

## 2022-11-20 DIAGNOSIS — M625A2 Muscle wasting and atrophy, not elsewhere classified, back, lumbosacral: Secondary | ICD-10-CM | POA: Diagnosis not present

## 2022-11-20 DIAGNOSIS — M545 Low back pain, unspecified: Secondary | ICD-10-CM | POA: Diagnosis not present

## 2022-11-20 DIAGNOSIS — M62551 Muscle wasting and atrophy, not elsewhere classified, right thigh: Secondary | ICD-10-CM | POA: Diagnosis not present

## 2022-11-20 DIAGNOSIS — M62552 Muscle wasting and atrophy, not elsewhere classified, left thigh: Secondary | ICD-10-CM | POA: Diagnosis not present

## 2022-11-22 DIAGNOSIS — M62552 Muscle wasting and atrophy, not elsewhere classified, left thigh: Secondary | ICD-10-CM | POA: Diagnosis not present

## 2022-11-22 DIAGNOSIS — M62551 Muscle wasting and atrophy, not elsewhere classified, right thigh: Secondary | ICD-10-CM | POA: Diagnosis not present

## 2022-11-22 DIAGNOSIS — M625A2 Muscle wasting and atrophy, not elsewhere classified, back, lumbosacral: Secondary | ICD-10-CM | POA: Diagnosis not present

## 2022-11-22 DIAGNOSIS — M545 Low back pain, unspecified: Secondary | ICD-10-CM | POA: Diagnosis not present

## 2022-11-27 DIAGNOSIS — M62551 Muscle wasting and atrophy, not elsewhere classified, right thigh: Secondary | ICD-10-CM | POA: Diagnosis not present

## 2022-11-27 DIAGNOSIS — M62552 Muscle wasting and atrophy, not elsewhere classified, left thigh: Secondary | ICD-10-CM | POA: Diagnosis not present

## 2022-11-27 DIAGNOSIS — M545 Low back pain, unspecified: Secondary | ICD-10-CM | POA: Diagnosis not present

## 2022-11-27 DIAGNOSIS — M625A2 Muscle wasting and atrophy, not elsewhere classified, back, lumbosacral: Secondary | ICD-10-CM | POA: Diagnosis not present

## 2022-12-04 DIAGNOSIS — M62551 Muscle wasting and atrophy, not elsewhere classified, right thigh: Secondary | ICD-10-CM | POA: Diagnosis not present

## 2022-12-04 DIAGNOSIS — M625A2 Muscle wasting and atrophy, not elsewhere classified, back, lumbosacral: Secondary | ICD-10-CM | POA: Diagnosis not present

## 2022-12-04 DIAGNOSIS — M545 Low back pain, unspecified: Secondary | ICD-10-CM | POA: Diagnosis not present

## 2022-12-04 DIAGNOSIS — M62552 Muscle wasting and atrophy, not elsewhere classified, left thigh: Secondary | ICD-10-CM | POA: Diagnosis not present

## 2022-12-06 DIAGNOSIS — M8580 Other specified disorders of bone density and structure, unspecified site: Secondary | ICD-10-CM | POA: Diagnosis not present

## 2022-12-06 DIAGNOSIS — I10 Essential (primary) hypertension: Secondary | ICD-10-CM | POA: Diagnosis not present

## 2022-12-06 DIAGNOSIS — I77819 Aortic ectasia, unspecified site: Secondary | ICD-10-CM | POA: Diagnosis not present

## 2022-12-06 DIAGNOSIS — E785 Hyperlipidemia, unspecified: Secondary | ICD-10-CM | POA: Diagnosis not present

## 2022-12-06 DIAGNOSIS — M62551 Muscle wasting and atrophy, not elsewhere classified, right thigh: Secondary | ICD-10-CM | POA: Diagnosis not present

## 2022-12-06 DIAGNOSIS — M06049 Rheumatoid arthritis without rheumatoid factor, unspecified hand: Secondary | ICD-10-CM | POA: Diagnosis not present

## 2022-12-06 DIAGNOSIS — I6529 Occlusion and stenosis of unspecified carotid artery: Secondary | ICD-10-CM | POA: Diagnosis not present

## 2022-12-06 DIAGNOSIS — M625A2 Muscle wasting and atrophy, not elsewhere classified, back, lumbosacral: Secondary | ICD-10-CM | POA: Diagnosis not present

## 2022-12-06 DIAGNOSIS — E039 Hypothyroidism, unspecified: Secondary | ICD-10-CM | POA: Diagnosis not present

## 2022-12-06 DIAGNOSIS — Z79899 Other long term (current) drug therapy: Secondary | ICD-10-CM | POA: Diagnosis not present

## 2022-12-06 DIAGNOSIS — M545 Low back pain, unspecified: Secondary | ICD-10-CM | POA: Diagnosis not present

## 2022-12-06 DIAGNOSIS — M62552 Muscle wasting and atrophy, not elsewhere classified, left thigh: Secondary | ICD-10-CM | POA: Diagnosis not present

## 2022-12-06 DIAGNOSIS — N952 Postmenopausal atrophic vaginitis: Secondary | ICD-10-CM | POA: Diagnosis not present

## 2022-12-11 DIAGNOSIS — M5416 Radiculopathy, lumbar region: Secondary | ICD-10-CM | POA: Diagnosis not present

## 2022-12-11 DIAGNOSIS — M62551 Muscle wasting and atrophy, not elsewhere classified, right thigh: Secondary | ICD-10-CM | POA: Diagnosis not present

## 2022-12-11 DIAGNOSIS — M625A2 Muscle wasting and atrophy, not elsewhere classified, back, lumbosacral: Secondary | ICD-10-CM | POA: Diagnosis not present

## 2022-12-11 DIAGNOSIS — M62552 Muscle wasting and atrophy, not elsewhere classified, left thigh: Secondary | ICD-10-CM | POA: Diagnosis not present

## 2022-12-11 DIAGNOSIS — Z6836 Body mass index (BMI) 36.0-36.9, adult: Secondary | ICD-10-CM | POA: Diagnosis not present

## 2022-12-11 DIAGNOSIS — M545 Low back pain, unspecified: Secondary | ICD-10-CM | POA: Diagnosis not present

## 2022-12-13 DIAGNOSIS — M62551 Muscle wasting and atrophy, not elsewhere classified, right thigh: Secondary | ICD-10-CM | POA: Diagnosis not present

## 2022-12-13 DIAGNOSIS — M625A2 Muscle wasting and atrophy, not elsewhere classified, back, lumbosacral: Secondary | ICD-10-CM | POA: Diagnosis not present

## 2022-12-13 DIAGNOSIS — M545 Low back pain, unspecified: Secondary | ICD-10-CM | POA: Diagnosis not present

## 2022-12-13 DIAGNOSIS — M62552 Muscle wasting and atrophy, not elsewhere classified, left thigh: Secondary | ICD-10-CM | POA: Diagnosis not present

## 2022-12-17 DIAGNOSIS — E669 Obesity, unspecified: Secondary | ICD-10-CM | POA: Diagnosis not present

## 2022-12-17 DIAGNOSIS — M5416 Radiculopathy, lumbar region: Secondary | ICD-10-CM | POA: Diagnosis not present

## 2022-12-17 DIAGNOSIS — M47816 Spondylosis without myelopathy or radiculopathy, lumbar region: Secondary | ICD-10-CM | POA: Diagnosis not present

## 2022-12-17 DIAGNOSIS — M5136 Other intervertebral disc degeneration, lumbar region: Secondary | ICD-10-CM | POA: Diagnosis not present

## 2022-12-17 DIAGNOSIS — M961 Postlaminectomy syndrome, not elsewhere classified: Secondary | ICD-10-CM | POA: Diagnosis not present

## 2022-12-17 DIAGNOSIS — Z1389 Encounter for screening for other disorder: Secondary | ICD-10-CM | POA: Diagnosis not present

## 2022-12-17 DIAGNOSIS — Z79891 Long term (current) use of opiate analgesic: Secondary | ICD-10-CM | POA: Diagnosis not present

## 2022-12-18 DIAGNOSIS — M545 Low back pain, unspecified: Secondary | ICD-10-CM | POA: Diagnosis not present

## 2022-12-18 DIAGNOSIS — M62551 Muscle wasting and atrophy, not elsewhere classified, right thigh: Secondary | ICD-10-CM | POA: Diagnosis not present

## 2022-12-18 DIAGNOSIS — M62552 Muscle wasting and atrophy, not elsewhere classified, left thigh: Secondary | ICD-10-CM | POA: Diagnosis not present

## 2022-12-18 DIAGNOSIS — M625A2 Muscle wasting and atrophy, not elsewhere classified, back, lumbosacral: Secondary | ICD-10-CM | POA: Diagnosis not present

## 2022-12-20 DIAGNOSIS — R351 Nocturia: Secondary | ICD-10-CM | POA: Diagnosis not present

## 2022-12-20 DIAGNOSIS — N3941 Urge incontinence: Secondary | ICD-10-CM | POA: Diagnosis not present

## 2022-12-21 DIAGNOSIS — M625A2 Muscle wasting and atrophy, not elsewhere classified, back, lumbosacral: Secondary | ICD-10-CM | POA: Diagnosis not present

## 2022-12-21 DIAGNOSIS — M545 Low back pain, unspecified: Secondary | ICD-10-CM | POA: Diagnosis not present

## 2022-12-21 DIAGNOSIS — M62551 Muscle wasting and atrophy, not elsewhere classified, right thigh: Secondary | ICD-10-CM | POA: Diagnosis not present

## 2022-12-21 DIAGNOSIS — M62552 Muscle wasting and atrophy, not elsewhere classified, left thigh: Secondary | ICD-10-CM | POA: Diagnosis not present

## 2022-12-25 DIAGNOSIS — M62552 Muscle wasting and atrophy, not elsewhere classified, left thigh: Secondary | ICD-10-CM | POA: Diagnosis not present

## 2022-12-25 DIAGNOSIS — M62551 Muscle wasting and atrophy, not elsewhere classified, right thigh: Secondary | ICD-10-CM | POA: Diagnosis not present

## 2022-12-25 DIAGNOSIS — M545 Low back pain, unspecified: Secondary | ICD-10-CM | POA: Diagnosis not present

## 2022-12-25 DIAGNOSIS — M625A2 Muscle wasting and atrophy, not elsewhere classified, back, lumbosacral: Secondary | ICD-10-CM | POA: Diagnosis not present

## 2022-12-28 DIAGNOSIS — M62551 Muscle wasting and atrophy, not elsewhere classified, right thigh: Secondary | ICD-10-CM | POA: Diagnosis not present

## 2022-12-28 DIAGNOSIS — M62552 Muscle wasting and atrophy, not elsewhere classified, left thigh: Secondary | ICD-10-CM | POA: Diagnosis not present

## 2022-12-28 DIAGNOSIS — M545 Low back pain, unspecified: Secondary | ICD-10-CM | POA: Diagnosis not present

## 2022-12-28 DIAGNOSIS — M625A2 Muscle wasting and atrophy, not elsewhere classified, back, lumbosacral: Secondary | ICD-10-CM | POA: Diagnosis not present

## 2023-01-01 DIAGNOSIS — M545 Low back pain, unspecified: Secondary | ICD-10-CM | POA: Diagnosis not present

## 2023-01-01 DIAGNOSIS — M62552 Muscle wasting and atrophy, not elsewhere classified, left thigh: Secondary | ICD-10-CM | POA: Diagnosis not present

## 2023-01-01 DIAGNOSIS — M625A2 Muscle wasting and atrophy, not elsewhere classified, back, lumbosacral: Secondary | ICD-10-CM | POA: Diagnosis not present

## 2023-01-01 DIAGNOSIS — M62551 Muscle wasting and atrophy, not elsewhere classified, right thigh: Secondary | ICD-10-CM | POA: Diagnosis not present

## 2023-01-03 DIAGNOSIS — M62552 Muscle wasting and atrophy, not elsewhere classified, left thigh: Secondary | ICD-10-CM | POA: Diagnosis not present

## 2023-01-03 DIAGNOSIS — M545 Low back pain, unspecified: Secondary | ICD-10-CM | POA: Diagnosis not present

## 2023-01-03 DIAGNOSIS — M625A2 Muscle wasting and atrophy, not elsewhere classified, back, lumbosacral: Secondary | ICD-10-CM | POA: Diagnosis not present

## 2023-01-03 DIAGNOSIS — M62551 Muscle wasting and atrophy, not elsewhere classified, right thigh: Secondary | ICD-10-CM | POA: Diagnosis not present

## 2023-01-07 DIAGNOSIS — N3941 Urge incontinence: Secondary | ICD-10-CM | POA: Diagnosis not present

## 2023-01-07 DIAGNOSIS — R351 Nocturia: Secondary | ICD-10-CM | POA: Diagnosis not present

## 2023-01-08 DIAGNOSIS — M62552 Muscle wasting and atrophy, not elsewhere classified, left thigh: Secondary | ICD-10-CM | POA: Diagnosis not present

## 2023-01-08 DIAGNOSIS — M545 Low back pain, unspecified: Secondary | ICD-10-CM | POA: Diagnosis not present

## 2023-01-08 DIAGNOSIS — M62551 Muscle wasting and atrophy, not elsewhere classified, right thigh: Secondary | ICD-10-CM | POA: Diagnosis not present

## 2023-01-08 DIAGNOSIS — M625A2 Muscle wasting and atrophy, not elsewhere classified, back, lumbosacral: Secondary | ICD-10-CM | POA: Diagnosis not present

## 2023-01-11 DIAGNOSIS — M545 Low back pain, unspecified: Secondary | ICD-10-CM | POA: Diagnosis not present

## 2023-01-11 DIAGNOSIS — M62552 Muscle wasting and atrophy, not elsewhere classified, left thigh: Secondary | ICD-10-CM | POA: Diagnosis not present

## 2023-01-11 DIAGNOSIS — M625A2 Muscle wasting and atrophy, not elsewhere classified, back, lumbosacral: Secondary | ICD-10-CM | POA: Diagnosis not present

## 2023-01-11 DIAGNOSIS — M62551 Muscle wasting and atrophy, not elsewhere classified, right thigh: Secondary | ICD-10-CM | POA: Diagnosis not present

## 2023-01-15 DIAGNOSIS — M62551 Muscle wasting and atrophy, not elsewhere classified, right thigh: Secondary | ICD-10-CM | POA: Diagnosis not present

## 2023-01-15 DIAGNOSIS — M62552 Muscle wasting and atrophy, not elsewhere classified, left thigh: Secondary | ICD-10-CM | POA: Diagnosis not present

## 2023-01-15 DIAGNOSIS — M545 Low back pain, unspecified: Secondary | ICD-10-CM | POA: Diagnosis not present

## 2023-01-15 DIAGNOSIS — M625A2 Muscle wasting and atrophy, not elsewhere classified, back, lumbosacral: Secondary | ICD-10-CM | POA: Diagnosis not present

## 2023-01-17 DIAGNOSIS — M961 Postlaminectomy syndrome, not elsewhere classified: Secondary | ICD-10-CM | POA: Diagnosis not present

## 2023-01-17 DIAGNOSIS — M5136 Other intervertebral disc degeneration, lumbar region: Secondary | ICD-10-CM | POA: Diagnosis not present

## 2023-01-17 DIAGNOSIS — Z79891 Long term (current) use of opiate analgesic: Secondary | ICD-10-CM | POA: Diagnosis not present

## 2023-01-17 DIAGNOSIS — Z1389 Encounter for screening for other disorder: Secondary | ICD-10-CM | POA: Diagnosis not present

## 2023-01-17 DIAGNOSIS — M47816 Spondylosis without myelopathy or radiculopathy, lumbar region: Secondary | ICD-10-CM | POA: Diagnosis not present

## 2023-01-17 DIAGNOSIS — G894 Chronic pain syndrome: Secondary | ICD-10-CM | POA: Diagnosis not present

## 2023-01-17 DIAGNOSIS — I10 Essential (primary) hypertension: Secondary | ICD-10-CM | POA: Diagnosis not present

## 2023-01-17 DIAGNOSIS — E669 Obesity, unspecified: Secondary | ICD-10-CM | POA: Diagnosis not present

## 2023-01-17 DIAGNOSIS — M5416 Radiculopathy, lumbar region: Secondary | ICD-10-CM | POA: Diagnosis not present

## 2023-01-17 DIAGNOSIS — M461 Sacroiliitis, not elsewhere classified: Secondary | ICD-10-CM | POA: Diagnosis not present

## 2023-01-18 DIAGNOSIS — M625A2 Muscle wasting and atrophy, not elsewhere classified, back, lumbosacral: Secondary | ICD-10-CM | POA: Diagnosis not present

## 2023-01-18 DIAGNOSIS — M62552 Muscle wasting and atrophy, not elsewhere classified, left thigh: Secondary | ICD-10-CM | POA: Diagnosis not present

## 2023-01-18 DIAGNOSIS — M545 Low back pain, unspecified: Secondary | ICD-10-CM | POA: Diagnosis not present

## 2023-01-18 DIAGNOSIS — M62551 Muscle wasting and atrophy, not elsewhere classified, right thigh: Secondary | ICD-10-CM | POA: Diagnosis not present

## 2023-01-21 DIAGNOSIS — M545 Low back pain, unspecified: Secondary | ICD-10-CM | POA: Diagnosis not present

## 2023-01-21 DIAGNOSIS — M625A2 Muscle wasting and atrophy, not elsewhere classified, back, lumbosacral: Secondary | ICD-10-CM | POA: Diagnosis not present

## 2023-01-21 DIAGNOSIS — M62552 Muscle wasting and atrophy, not elsewhere classified, left thigh: Secondary | ICD-10-CM | POA: Diagnosis not present

## 2023-01-21 DIAGNOSIS — M62551 Muscle wasting and atrophy, not elsewhere classified, right thigh: Secondary | ICD-10-CM | POA: Diagnosis not present

## 2023-01-24 DIAGNOSIS — M62552 Muscle wasting and atrophy, not elsewhere classified, left thigh: Secondary | ICD-10-CM | POA: Diagnosis not present

## 2023-01-24 DIAGNOSIS — M62551 Muscle wasting and atrophy, not elsewhere classified, right thigh: Secondary | ICD-10-CM | POA: Diagnosis not present

## 2023-01-24 DIAGNOSIS — M545 Low back pain, unspecified: Secondary | ICD-10-CM | POA: Diagnosis not present

## 2023-01-24 DIAGNOSIS — M625A2 Muscle wasting and atrophy, not elsewhere classified, back, lumbosacral: Secondary | ICD-10-CM | POA: Diagnosis not present

## 2023-01-31 DIAGNOSIS — M625A2 Muscle wasting and atrophy, not elsewhere classified, back, lumbosacral: Secondary | ICD-10-CM | POA: Diagnosis not present

## 2023-01-31 DIAGNOSIS — M545 Low back pain, unspecified: Secondary | ICD-10-CM | POA: Diagnosis not present

## 2023-01-31 DIAGNOSIS — M62552 Muscle wasting and atrophy, not elsewhere classified, left thigh: Secondary | ICD-10-CM | POA: Diagnosis not present

## 2023-01-31 DIAGNOSIS — M62551 Muscle wasting and atrophy, not elsewhere classified, right thigh: Secondary | ICD-10-CM | POA: Diagnosis not present

## 2023-02-04 DIAGNOSIS — M62551 Muscle wasting and atrophy, not elsewhere classified, right thigh: Secondary | ICD-10-CM | POA: Diagnosis not present

## 2023-02-04 DIAGNOSIS — M625A2 Muscle wasting and atrophy, not elsewhere classified, back, lumbosacral: Secondary | ICD-10-CM | POA: Diagnosis not present

## 2023-02-04 DIAGNOSIS — M62552 Muscle wasting and atrophy, not elsewhere classified, left thigh: Secondary | ICD-10-CM | POA: Diagnosis not present

## 2023-02-04 DIAGNOSIS — M545 Low back pain, unspecified: Secondary | ICD-10-CM | POA: Diagnosis not present

## 2023-02-07 DIAGNOSIS — M62551 Muscle wasting and atrophy, not elsewhere classified, right thigh: Secondary | ICD-10-CM | POA: Diagnosis not present

## 2023-02-07 DIAGNOSIS — M545 Low back pain, unspecified: Secondary | ICD-10-CM | POA: Diagnosis not present

## 2023-02-07 DIAGNOSIS — M62552 Muscle wasting and atrophy, not elsewhere classified, left thigh: Secondary | ICD-10-CM | POA: Diagnosis not present

## 2023-02-07 DIAGNOSIS — M625A2 Muscle wasting and atrophy, not elsewhere classified, back, lumbosacral: Secondary | ICD-10-CM | POA: Diagnosis not present

## 2023-02-12 DIAGNOSIS — M625A2 Muscle wasting and atrophy, not elsewhere classified, back, lumbosacral: Secondary | ICD-10-CM | POA: Diagnosis not present

## 2023-02-12 DIAGNOSIS — M62552 Muscle wasting and atrophy, not elsewhere classified, left thigh: Secondary | ICD-10-CM | POA: Diagnosis not present

## 2023-02-12 DIAGNOSIS — M62551 Muscle wasting and atrophy, not elsewhere classified, right thigh: Secondary | ICD-10-CM | POA: Diagnosis not present

## 2023-02-12 DIAGNOSIS — M545 Low back pain, unspecified: Secondary | ICD-10-CM | POA: Diagnosis not present

## 2023-02-14 DIAGNOSIS — M625A2 Muscle wasting and atrophy, not elsewhere classified, back, lumbosacral: Secondary | ICD-10-CM | POA: Diagnosis not present

## 2023-02-14 DIAGNOSIS — M62551 Muscle wasting and atrophy, not elsewhere classified, right thigh: Secondary | ICD-10-CM | POA: Diagnosis not present

## 2023-02-14 DIAGNOSIS — M62552 Muscle wasting and atrophy, not elsewhere classified, left thigh: Secondary | ICD-10-CM | POA: Diagnosis not present

## 2023-02-14 DIAGNOSIS — M545 Low back pain, unspecified: Secondary | ICD-10-CM | POA: Diagnosis not present

## 2023-02-15 DIAGNOSIS — Z1389 Encounter for screening for other disorder: Secondary | ICD-10-CM | POA: Diagnosis not present

## 2023-02-15 DIAGNOSIS — M5136 Other intervertebral disc degeneration, lumbar region: Secondary | ICD-10-CM | POA: Diagnosis not present

## 2023-02-15 DIAGNOSIS — M961 Postlaminectomy syndrome, not elsewhere classified: Secondary | ICD-10-CM | POA: Diagnosis not present

## 2023-02-15 DIAGNOSIS — E669 Obesity, unspecified: Secondary | ICD-10-CM | POA: Diagnosis not present

## 2023-02-15 DIAGNOSIS — M461 Sacroiliitis, not elsewhere classified: Secondary | ICD-10-CM | POA: Diagnosis not present

## 2023-02-15 DIAGNOSIS — M5416 Radiculopathy, lumbar region: Secondary | ICD-10-CM | POA: Diagnosis not present

## 2023-02-15 DIAGNOSIS — Z79891 Long term (current) use of opiate analgesic: Secondary | ICD-10-CM | POA: Diagnosis not present

## 2023-02-15 DIAGNOSIS — M47816 Spondylosis without myelopathy or radiculopathy, lumbar region: Secondary | ICD-10-CM | POA: Diagnosis not present

## 2023-02-19 DIAGNOSIS — M62552 Muscle wasting and atrophy, not elsewhere classified, left thigh: Secondary | ICD-10-CM | POA: Diagnosis not present

## 2023-02-19 DIAGNOSIS — M62551 Muscle wasting and atrophy, not elsewhere classified, right thigh: Secondary | ICD-10-CM | POA: Diagnosis not present

## 2023-02-19 DIAGNOSIS — M625A2 Muscle wasting and atrophy, not elsewhere classified, back, lumbosacral: Secondary | ICD-10-CM | POA: Diagnosis not present

## 2023-02-19 DIAGNOSIS — M545 Low back pain, unspecified: Secondary | ICD-10-CM | POA: Diagnosis not present

## 2023-02-26 DIAGNOSIS — M625A2 Muscle wasting and atrophy, not elsewhere classified, back, lumbosacral: Secondary | ICD-10-CM | POA: Diagnosis not present

## 2023-02-26 DIAGNOSIS — M545 Low back pain, unspecified: Secondary | ICD-10-CM | POA: Diagnosis not present

## 2023-02-26 DIAGNOSIS — M62552 Muscle wasting and atrophy, not elsewhere classified, left thigh: Secondary | ICD-10-CM | POA: Diagnosis not present

## 2023-02-26 DIAGNOSIS — M62551 Muscle wasting and atrophy, not elsewhere classified, right thigh: Secondary | ICD-10-CM | POA: Diagnosis not present

## 2023-02-27 DIAGNOSIS — R351 Nocturia: Secondary | ICD-10-CM | POA: Diagnosis not present

## 2023-02-27 DIAGNOSIS — N8111 Cystocele, midline: Secondary | ICD-10-CM | POA: Diagnosis not present

## 2023-02-27 DIAGNOSIS — R3911 Hesitancy of micturition: Secondary | ICD-10-CM | POA: Diagnosis not present

## 2023-02-27 DIAGNOSIS — Z79899 Other long term (current) drug therapy: Secondary | ICD-10-CM | POA: Diagnosis not present

## 2023-03-05 DIAGNOSIS — M461 Sacroiliitis, not elsewhere classified: Secondary | ICD-10-CM | POA: Diagnosis not present

## 2023-03-13 DIAGNOSIS — Z9181 History of falling: Secondary | ICD-10-CM | POA: Diagnosis not present

## 2023-03-13 DIAGNOSIS — Z139 Encounter for screening, unspecified: Secondary | ICD-10-CM | POA: Diagnosis not present

## 2023-03-13 DIAGNOSIS — Z1211 Encounter for screening for malignant neoplasm of colon: Secondary | ICD-10-CM | POA: Diagnosis not present

## 2023-03-13 DIAGNOSIS — Z0000 Encounter for general adult medical examination without abnormal findings: Secondary | ICD-10-CM | POA: Diagnosis not present

## 2023-03-13 DIAGNOSIS — Z1331 Encounter for screening for depression: Secondary | ICD-10-CM | POA: Diagnosis not present

## 2023-03-22 DIAGNOSIS — M461 Sacroiliitis, not elsewhere classified: Secondary | ICD-10-CM | POA: Diagnosis not present

## 2023-03-22 DIAGNOSIS — E669 Obesity, unspecified: Secondary | ICD-10-CM | POA: Diagnosis not present

## 2023-03-22 DIAGNOSIS — M47816 Spondylosis without myelopathy or radiculopathy, lumbar region: Secondary | ICD-10-CM | POA: Diagnosis not present

## 2023-03-22 DIAGNOSIS — M5416 Radiculopathy, lumbar region: Secondary | ICD-10-CM | POA: Diagnosis not present

## 2023-03-22 DIAGNOSIS — Z1389 Encounter for screening for other disorder: Secondary | ICD-10-CM | POA: Diagnosis not present

## 2023-03-22 DIAGNOSIS — Z79891 Long term (current) use of opiate analgesic: Secondary | ICD-10-CM | POA: Diagnosis not present

## 2023-03-22 DIAGNOSIS — M961 Postlaminectomy syndrome, not elsewhere classified: Secondary | ICD-10-CM | POA: Diagnosis not present

## 2023-04-03 DIAGNOSIS — R3915 Urgency of urination: Secondary | ICD-10-CM | POA: Diagnosis not present

## 2023-04-03 DIAGNOSIS — N8111 Cystocele, midline: Secondary | ICD-10-CM | POA: Diagnosis not present

## 2023-04-03 DIAGNOSIS — N952 Postmenopausal atrophic vaginitis: Secondary | ICD-10-CM | POA: Diagnosis not present

## 2023-04-03 DIAGNOSIS — R351 Nocturia: Secondary | ICD-10-CM | POA: Diagnosis not present

## 2023-04-30 DIAGNOSIS — M461 Sacroiliitis, not elsewhere classified: Secondary | ICD-10-CM | POA: Diagnosis not present

## 2023-05-02 DIAGNOSIS — Z1389 Encounter for screening for other disorder: Secondary | ICD-10-CM | POA: Diagnosis not present

## 2023-05-02 DIAGNOSIS — G894 Chronic pain syndrome: Secondary | ICD-10-CM | POA: Diagnosis not present

## 2023-05-02 DIAGNOSIS — M47816 Spondylosis without myelopathy or radiculopathy, lumbar region: Secondary | ICD-10-CM | POA: Diagnosis not present

## 2023-05-02 DIAGNOSIS — E669 Obesity, unspecified: Secondary | ICD-10-CM | POA: Diagnosis not present

## 2023-05-02 DIAGNOSIS — Z79891 Long term (current) use of opiate analgesic: Secondary | ICD-10-CM | POA: Diagnosis not present

## 2023-05-02 DIAGNOSIS — M461 Sacroiliitis, not elsewhere classified: Secondary | ICD-10-CM | POA: Diagnosis not present

## 2023-05-02 DIAGNOSIS — M961 Postlaminectomy syndrome, not elsewhere classified: Secondary | ICD-10-CM | POA: Diagnosis not present

## 2023-06-07 DIAGNOSIS — E669 Obesity, unspecified: Secondary | ICD-10-CM | POA: Diagnosis not present

## 2023-06-07 DIAGNOSIS — G894 Chronic pain syndrome: Secondary | ICD-10-CM | POA: Diagnosis not present

## 2023-06-07 DIAGNOSIS — Z1389 Encounter for screening for other disorder: Secondary | ICD-10-CM | POA: Diagnosis not present

## 2023-06-07 DIAGNOSIS — M47816 Spondylosis without myelopathy or radiculopathy, lumbar region: Secondary | ICD-10-CM | POA: Diagnosis not present

## 2023-06-07 DIAGNOSIS — M961 Postlaminectomy syndrome, not elsewhere classified: Secondary | ICD-10-CM | POA: Diagnosis not present

## 2023-06-07 DIAGNOSIS — Z79891 Long term (current) use of opiate analgesic: Secondary | ICD-10-CM | POA: Diagnosis not present

## 2023-06-07 DIAGNOSIS — M461 Sacroiliitis, not elsewhere classified: Secondary | ICD-10-CM | POA: Diagnosis not present

## 2023-07-10 DIAGNOSIS — G894 Chronic pain syndrome: Secondary | ICD-10-CM | POA: Diagnosis not present

## 2023-07-10 DIAGNOSIS — M461 Sacroiliitis, not elsewhere classified: Secondary | ICD-10-CM | POA: Diagnosis not present

## 2023-07-10 DIAGNOSIS — M47816 Spondylosis without myelopathy or radiculopathy, lumbar region: Secondary | ICD-10-CM | POA: Diagnosis not present

## 2023-07-10 DIAGNOSIS — Z79891 Long term (current) use of opiate analgesic: Secondary | ICD-10-CM | POA: Diagnosis not present

## 2023-07-10 DIAGNOSIS — Z1389 Encounter for screening for other disorder: Secondary | ICD-10-CM | POA: Diagnosis not present

## 2023-07-10 DIAGNOSIS — E669 Obesity, unspecified: Secondary | ICD-10-CM | POA: Diagnosis not present

## 2023-07-10 DIAGNOSIS — M961 Postlaminectomy syndrome, not elsewhere classified: Secondary | ICD-10-CM | POA: Diagnosis not present

## 2023-08-07 DIAGNOSIS — M461 Sacroiliitis, not elsewhere classified: Secondary | ICD-10-CM | POA: Diagnosis not present

## 2023-08-07 DIAGNOSIS — E669 Obesity, unspecified: Secondary | ICD-10-CM | POA: Diagnosis not present

## 2023-08-07 DIAGNOSIS — G894 Chronic pain syndrome: Secondary | ICD-10-CM | POA: Diagnosis not present

## 2023-08-07 DIAGNOSIS — M47816 Spondylosis without myelopathy or radiculopathy, lumbar region: Secondary | ICD-10-CM | POA: Diagnosis not present

## 2023-08-07 DIAGNOSIS — M961 Postlaminectomy syndrome, not elsewhere classified: Secondary | ICD-10-CM | POA: Diagnosis not present

## 2023-08-07 DIAGNOSIS — Z79891 Long term (current) use of opiate analgesic: Secondary | ICD-10-CM | POA: Diagnosis not present

## 2023-08-07 DIAGNOSIS — Z1389 Encounter for screening for other disorder: Secondary | ICD-10-CM | POA: Diagnosis not present

## 2023-08-08 DIAGNOSIS — R053 Chronic cough: Secondary | ICD-10-CM | POA: Diagnosis not present

## 2023-08-08 DIAGNOSIS — M25462 Effusion, left knee: Secondary | ICD-10-CM | POA: Diagnosis not present

## 2023-08-08 DIAGNOSIS — I77819 Aortic ectasia, unspecified site: Secondary | ICD-10-CM | POA: Diagnosis not present

## 2023-08-08 DIAGNOSIS — I6529 Occlusion and stenosis of unspecified carotid artery: Secondary | ICD-10-CM | POA: Diagnosis not present

## 2023-08-08 DIAGNOSIS — N952 Postmenopausal atrophic vaginitis: Secondary | ICD-10-CM | POA: Diagnosis not present

## 2023-08-08 DIAGNOSIS — Z79899 Other long term (current) drug therapy: Secondary | ICD-10-CM | POA: Diagnosis not present

## 2023-08-08 DIAGNOSIS — M8580 Other specified disorders of bone density and structure, unspecified site: Secondary | ICD-10-CM | POA: Diagnosis not present

## 2023-08-08 DIAGNOSIS — I10 Essential (primary) hypertension: Secondary | ICD-10-CM | POA: Diagnosis not present

## 2023-08-08 DIAGNOSIS — E785 Hyperlipidemia, unspecified: Secondary | ICD-10-CM | POA: Diagnosis not present

## 2023-08-08 DIAGNOSIS — M06049 Rheumatoid arthritis without rheumatoid factor, unspecified hand: Secondary | ICD-10-CM | POA: Diagnosis not present

## 2023-08-14 ENCOUNTER — Other Ambulatory Visit: Payer: Self-pay

## 2023-08-14 DIAGNOSIS — I6523 Occlusion and stenosis of bilateral carotid arteries: Secondary | ICD-10-CM

## 2023-08-14 DIAGNOSIS — I77811 Abdominal aortic ectasia: Secondary | ICD-10-CM

## 2023-08-15 DIAGNOSIS — M25511 Pain in right shoulder: Secondary | ICD-10-CM | POA: Diagnosis not present

## 2023-08-23 NOTE — Progress Notes (Signed)
 HISTORY AND PHYSICAL     CC:  follow up. Requesting Provider:  No ref. provider found  HPI: This is a 83 y.o. female who has hx of left carotid endarterectomy in January 2015 and right carotid endarterectomy in January 2014 at [facility].  Both surgeries were performed for asymptomatic carotid stenosis.   She also has hx of aortic ectasia.   Pt was last seen 08/16/2022 and at that time she was not having any neurological sx.  She was not taking asa due to the tylenol in her pain medication and it was causing bruising on her skin and pain management had told her to stop taking it.  She was having back pain due to spinal stenosis and was planning for surgery.  She was not having any new back or abdominal pain.   Pt returns today for follow up.    Pt denies any amaurosis fugax, speech difficulties, weakness, numbness, paralysis or clumsiness or facial droop.  She denies any new severe abdominal or back pain.   She states that her back pain gets worse the older she gets.  She does have some left leg weakness that she attributes to her back issues.  She does water exercises 3 times per week and she does well with this.  This helps her stay active as she does have RA.    The pt now takes a baby asa daily.   The pt is on a statin for cholesterol management.  The pt is on a daily aspirin.   Other AC:  none The pt is not on medication for hypertension.   The pt is not on medication for diabetes Tobacco hx:  former  Pt does  have family hx of AAA.  She is one of 9 children and she and her brother are the only ones with AAA.  Her brother's AAA has been repaired   Past Medical History:  Diagnosis Date   AAA (abdominal aortic aneurysm) (HCC)    Aortic ectasia (HCC)    Cancer (HCC)    hx of skin cancer   Carotid artery stenosis    COPD (chronic obstructive pulmonary disease) (HCC)    Family history of bladder cancer    Family history of colon cancer    Family history of genetic  mutation for hereditary nonpolyposis colorectal cancer (HNPCC)    Family history of kidney cancer    Lung nodule seen on imaging study 2004   [facility] per pt, she was told "it was the size of a grain of salt"   Osteoarthritis     Past Surgical History:  Procedure Laterality Date   ABDOMINAL HYSTERECTOMY  1974   APPENDECTOMY  1960   BACK SURGERY     1959, 1973, 2014   CAROTID ENDARTERECTOMY Left 2015   CAROTID ENDARTERECTOMY Right 2014   CARPAL TUNNEL RELEASE Bilateral 1960   CATARACT EXTRACTION Bilateral 2017   CHOLECYSTECTOMY  2016   LUMBAR LAMINECTOMY/DECOMPRESSION MICRODISCECTOMY N/A 08/22/2022   Procedure: Left Lumbar Two-Lumbar Three Decompressive Hemilaminectomy with sublaminar decompression;  Surgeon: Tia Alert, MD;  Location: [facility] OR;  Service: Neurosurgery;  Laterality: N/A;  3C   TOTAL KNEE ARTHROPLASTY Right 2014    Allergies  Allergen Reactions   Other Shortness Of Breath and Rash    Quinolone antibiotics-short of breath, and rash   Penicillins Hives and Shortness Of Breath    Difficulty swallowing, difficulty breathing, hives   Codeine Nausea Only   Statins Other (See Comments)  Muscle pain/spasms     Current Outpatient Medications  Medication Sig Dispense Refill   alendronate (FOSAMAX) 70 MG tablet Take 70 mg by mouth every Saturday.     celecoxib (CELEBREX) 200 MG capsule Take 200 mg by mouth 2 (two) times daily.     HYDROcodone-acetaminophen (NORCO) 10-325 MG tablet Take 1 tablet by mouth every 4 (four) hours as needed. 30 tablet 0   hydrOXYzine (VISTARIL) 25 MG capsule Take 25-50 mg by mouth at bedtime.     rosuvastatin (CRESTOR) 5 MG tablet Take 5 mg by mouth at bedtime.     triamcinolone cream (KENALOG) 0.1 % Apply 1 application  topically 2 (two) times daily as needed (eczema).     No current facility-administered medications for this visit.    Family History  Problem Relation Age of Onset   Colon cancer Mother 62       d. 59    Tuberculosis Father    Bladder Cancer Sister 37       bilateral urothelial   Bladder Cancer Brother 51       urothelial   Diabetes Maternal Aunt    Bladder Cancer Brother        urothelial   Kidney cancer Brother    Bladder Cancer Other        urothelial, d. 80 - niece   Schizophrenia Other    Colon cancer Other        dx twice, under 50 and over 30; nephew   Testicular cancer Other 68       grand-nephew    Social History   Socioeconomic History   Marital status: Widowed    Spouse name: Not on file   Number of children: 2   Years of education: Not on file   Highest education level: Not on file  Occupational History   Not on file  Tobacco Use   Smoking status: Former    Current packs/day: 0.00    Types: Cigarettes    Quit date: 05/28/1998    Years since quitting: 25.2    Passive exposure: Never   Smokeless tobacco: Never  Vaping Use   Vaping status: Never Used  Substance and Sexual Activity   Alcohol use: Never   Drug use: Never   Sexual activity: Not on file  Other Topics Concern   Not on file  Social History Narrative   Son passed away in 09-14-19.   Social Drivers of Corporate investment banker Strain: Not on file  Food Insecurity: Not on file  Transportation Needs: Not on file  Physical Activity: Not on file  Stress: Not on file  Social Connections: Not on file  Intimate Partner Violence: Not on file     REVIEW OF SYSTEMS:   [X]  denotes positive finding, [ ]  denotes negative finding Cardiac  Comments:  Chest pain or chest pressure:    Shortness of breath upon exertion:    Short of breath when lying flat:    Irregular heart rhythm:        Vascular    Pain in calf, thigh, or hip brought on by ambulation:    Pain in feet at night that wakes you up from your sleep:     Blood clot in your veins:    Leg swelling:         Pulmonary    Oxygen at home:    Productive cough:     Wheezing:         Neurologic  Sudden weakness in arms or legs:      Sudden numbness in arms or legs:     Sudden onset of difficulty speaking or slurred speech:    Temporary loss of vision in one eye:     Problems with dizziness:         Gastrointestinal    Blood in stool:     Vomited blood:         Genitourinary    Burning when urinating:     Blood in urine:        Psychiatric    Major depression:         Hematologic    Bleeding problems:    Problems with blood clotting too easily:        Skin    Rashes or ulcers:        Constitutional    Fever or chills:      PHYSICAL EXAMINATION:  Today's Vitals   08/26/23 0833 08/26/23 0838  BP: (!) 128/56 (!) 145/78  Pulse: 73   Temp: 97.6 F (36.4 C)   TempSrc: Temporal   SpO2: 94%   Weight: 201 lb 11.2 oz (91.5 kg)   Height: 5' (1.524 m)   PainSc: 6     Body mass index is 39.39 kg/m.   General:  WDWN in NAD; vital signs documented above Gait: Not observed HENT: WNL, normocephalic Pulmonary: normal non-labored breathing Cardiac: regular HR, without carotid bruits Abdomen: soft, NT; aortic pulse is not palpable Skin: without rashes Vascular Exam/Pulses:  Right Left  Radial 2+ (normal) 2+ (normal)  Popliteal Unable to palpate Unable to palpate  DP Multiphasic doppler Multiphasic doppler  PT Multiphasic doppler Multiphasic doppler   Extremities: without open wounds Musculoskeletal: no muscle wasting or atrophy  Neurologic: A&O X 3; moving all extremities equally; speech is fluent/normal Psychiatric:  The pt has Normal affect.   Non-Invasive Vascular Imaging:   Carotid Duplex on 08/26/2023 Right:  40-59% ICA stenosis Left:  40-59% ICA stenosis   AAA duplex 08/26/2023: Abdominal Aorta Findings:  +-----------+-------+----------+----------+--------+--------+--------+  Location  AP (cm)Trans (cm)PSV (cm/s)WaveformThrombusComments  +-----------+-------+----------+----------+--------+--------+--------+  Proximal  2.13   2.07      76                                   +-----------+-------+----------+----------+--------+--------+--------+  Mid       2.88   2.52      61                                  +-----------+-------+----------+----------+--------+--------+--------+  Distal    3.18   3.20      57                                  +-----------+-------+----------+----------+--------+--------+--------+  RT CIA Prox1.2    1.5       108                                 +-----------+-------+----------+----------+--------+--------+--------+  LT CIA Prox1.2    1.0       125                                 +-----------+-------+----------+----------+--------+--------+--------+  Summary:  Abdominal Aorta: There is evidence of abnormal dilatation of the distal Abdominal aorta. The largest aortic measurement is 3.2 cm. The largest aortic diameter remains essentially unchanged compared to prior exam. Previous diameter measurement was 3.2 cm   Previous Carotid duplex on 08/16/2022: Right: 40-59% ICA stenosis Left:   40-59% ICA stenosis  Previous AAA duplex 06/14/2021: Summary:  Abdominal Aorta: There is evidence of abnormal dilatation of the distal  Abdominal aorta. The largest aortic measurement is 3.2 cm. The largest aortic diameter remains essentially unchanged compared to prior exam. Previous diameter measurement was 3.3 cm obtained on 08/01/2020.    ASSESSMENT/PLAN:: 83 y.o. female here for follow up carotid artery stenosis and hx of left carotid endarterectomy in January 2015 and right carotid endarterectomy in January 2014 at [facility].  Both surgeries were performed for asymptomatic carotid stenosis.   She also has hx of aortic ectasia.   Carotid stenosis -duplex today reveals bilateral ICA stenosis unchanged at 40-59% bilaterally (low end) -discussed s/s of stroke with pt and she understands should she develop any of these sx, she will go to the nearest ER or call 911. -pt will f/u in one year with carotid  duplex -pt will call sooner should she have any issues. -continue statin and baby asa daily.    AAA -duplex today reveals AAA remains 3.2cm.  She knows to call 911 if she develops new severe sudden abdominal or back pain. She does have children. We discussed they should be screened at some point given she and her brother have hx of AAA (brother's has been repaired).  -she will f/u in 2 years with AAA duplex as this has not changed over the past 2 years.  -encouraged her to continue to do her water exercises regularly as she is doing them 3x/week.    Doreatha Massed, [facility] [phone]  Clinic MD:  Myra Gianotti

## 2023-08-26 ENCOUNTER — Ambulatory Visit (INDEPENDENT_AMBULATORY_CARE_PROVIDER_SITE_OTHER)
Admission: RE | Admit: 2023-08-26 | Discharge: 2023-08-26 | Disposition: A | Discharge status: Home / Self Care | Payer: Self-pay | Source: Ambulatory Visit | Attending: Surgery | Admitting: Surgery

## 2023-08-26 ENCOUNTER — Ambulatory Visit: Payer: Medicare HMO | Admitting: Physician Assistant

## 2023-08-26 ENCOUNTER — Ambulatory Visit (HOSPITAL_COMMUNITY)
Admission: RE | Admit: 2023-08-26 | Discharge: 2023-08-26 | Disposition: A | Discharge status: Home / Self Care | Payer: Self-pay | Source: Ambulatory Visit | Attending: Surgery | Admitting: Surgery

## 2023-08-26 VITALS — BP 145/78 | HR 73 | Temp 97.6°F | Ht 60.0 in | Wt 201.7 lb

## 2023-08-26 DIAGNOSIS — I6523 Occlusion and stenosis of bilateral carotid arteries: Secondary | ICD-10-CM | POA: Diagnosis not present

## 2023-08-26 DIAGNOSIS — I77811 Abdominal aortic ectasia: Secondary | ICD-10-CM | POA: Insufficient documentation

## 2023-08-29 DIAGNOSIS — N952 Postmenopausal atrophic vaginitis: Secondary | ICD-10-CM | POA: Diagnosis not present

## 2023-08-29 DIAGNOSIS — R3915 Urgency of urination: Secondary | ICD-10-CM | POA: Diagnosis not present

## 2023-08-29 DIAGNOSIS — R351 Nocturia: Secondary | ICD-10-CM | POA: Diagnosis not present

## 2023-08-29 DIAGNOSIS — N8111 Cystocele, midline: Secondary | ICD-10-CM | POA: Diagnosis not present

## 2023-09-03 DIAGNOSIS — M461 Sacroiliitis, not elsewhere classified: Secondary | ICD-10-CM | POA: Diagnosis not present

## 2023-09-03 DIAGNOSIS — M961 Postlaminectomy syndrome, not elsewhere classified: Secondary | ICD-10-CM | POA: Diagnosis not present

## 2023-09-03 DIAGNOSIS — Z1389 Encounter for screening for other disorder: Secondary | ICD-10-CM | POA: Diagnosis not present

## 2023-09-03 DIAGNOSIS — M47816 Spondylosis without myelopathy or radiculopathy, lumbar region: Secondary | ICD-10-CM | POA: Diagnosis not present

## 2023-09-03 DIAGNOSIS — G894 Chronic pain syndrome: Secondary | ICD-10-CM | POA: Diagnosis not present

## 2023-09-03 DIAGNOSIS — E669 Obesity, unspecified: Secondary | ICD-10-CM | POA: Diagnosis not present

## 2023-09-03 DIAGNOSIS — Z79891 Long term (current) use of opiate analgesic: Secondary | ICD-10-CM | POA: Diagnosis not present

## 2023-09-12 DIAGNOSIS — Z981 Arthrodesis status: Secondary | ICD-10-CM | POA: Diagnosis not present

## 2023-09-12 DIAGNOSIS — M47816 Spondylosis without myelopathy or radiculopathy, lumbar region: Secondary | ICD-10-CM | POA: Diagnosis not present

## 2023-10-11 DIAGNOSIS — G894 Chronic pain syndrome: Secondary | ICD-10-CM | POA: Diagnosis not present

## 2023-10-11 DIAGNOSIS — M47816 Spondylosis without myelopathy or radiculopathy, lumbar region: Secondary | ICD-10-CM | POA: Diagnosis not present

## 2023-10-29 DIAGNOSIS — M47816 Spondylosis without myelopathy or radiculopathy, lumbar region: Secondary | ICD-10-CM | POA: Diagnosis not present

## 2023-11-14 DIAGNOSIS — M47816 Spondylosis without myelopathy or radiculopathy, lumbar region: Secondary | ICD-10-CM | POA: Diagnosis not present

## 2023-12-18 DIAGNOSIS — M47816 Spondylosis without myelopathy or radiculopathy, lumbar region: Secondary | ICD-10-CM | POA: Diagnosis not present

## 2024-01-03 DIAGNOSIS — Z789 Other specified health status: Secondary | ICD-10-CM | POA: Diagnosis not present

## 2024-01-03 DIAGNOSIS — Z7409 Other reduced mobility: Secondary | ICD-10-CM | POA: Diagnosis not present

## 2024-01-03 DIAGNOSIS — L603 Nail dystrophy: Secondary | ICD-10-CM | POA: Diagnosis not present

## 2024-01-03 DIAGNOSIS — M79674 Pain in right toe(s): Secondary | ICD-10-CM | POA: Diagnosis not present

## 2024-01-08 DIAGNOSIS — H40053 Ocular hypertension, bilateral: Secondary | ICD-10-CM | POA: Diagnosis not present

## 2024-01-23 DIAGNOSIS — Z79891 Long term (current) use of opiate analgesic: Secondary | ICD-10-CM | POA: Diagnosis not present

## 2024-01-23 DIAGNOSIS — M47816 Spondylosis without myelopathy or radiculopathy, lumbar region: Secondary | ICD-10-CM | POA: Diagnosis not present

## 2024-01-31 ENCOUNTER — Emergency Department (HOSPITAL_COMMUNITY)

## 2024-01-31 ENCOUNTER — Other Ambulatory Visit: Payer: Self-pay

## 2024-01-31 ENCOUNTER — Encounter (HOSPITAL_COMMUNITY): Payer: Self-pay

## 2024-01-31 ENCOUNTER — Emergency Department (HOSPITAL_COMMUNITY)
Admission: EM | Admit: 2024-01-31 | Discharge: 2024-01-31 | Disposition: A | Discharge status: Home / Self Care | Attending: Emergency Medicine | Admitting: Emergency Medicine

## 2024-01-31 DIAGNOSIS — M542 Cervicalgia: Secondary | ICD-10-CM | POA: Diagnosis not present

## 2024-01-31 DIAGNOSIS — M7918 Myalgia, other site: Secondary | ICD-10-CM

## 2024-01-31 DIAGNOSIS — H9203 Otalgia, bilateral: Secondary | ICD-10-CM | POA: Diagnosis not present

## 2024-01-31 DIAGNOSIS — M791 Myalgia, unspecified site: Secondary | ICD-10-CM | POA: Diagnosis not present

## 2024-01-31 DIAGNOSIS — M25519 Pain in unspecified shoulder: Secondary | ICD-10-CM | POA: Diagnosis not present

## 2024-01-31 DIAGNOSIS — J449 Chronic obstructive pulmonary disease, unspecified: Secondary | ICD-10-CM | POA: Insufficient documentation

## 2024-01-31 DIAGNOSIS — D72829 Elevated white blood cell count, unspecified: Secondary | ICD-10-CM | POA: Diagnosis not present

## 2024-01-31 DIAGNOSIS — R9431 Abnormal electrocardiogram [ECG] [EKG]: Secondary | ICD-10-CM | POA: Diagnosis not present

## 2024-01-31 DIAGNOSIS — M25512 Pain in left shoulder: Secondary | ICD-10-CM | POA: Insufficient documentation

## 2024-01-31 DIAGNOSIS — I10 Essential (primary) hypertension: Secondary | ICD-10-CM | POA: Diagnosis not present

## 2024-01-31 LAB — URINALYSIS, ROUTINE W REFLEX MICROSCOPIC
Bilirubin Urine: NEGATIVE
Glucose, UA: NEGATIVE mg/dL
Hgb urine dipstick: NEGATIVE
Ketones, ur: NEGATIVE mg/dL
Leukocytes,Ua: NEGATIVE
Nitrite: NEGATIVE
Protein, ur: NEGATIVE mg/dL
Specific Gravity, Urine: 1.008 (ref 1.005–1.030)
pH: 7 (ref 5.0–8.0)

## 2024-01-31 LAB — TROPONIN I (HIGH SENSITIVITY)
Troponin I (High Sensitivity): 7 ng/L (ref <18)
Troponin I (High Sensitivity): 8 ng/L (ref <18)

## 2024-01-31 LAB — CBC WITH DIFFERENTIAL/PLATELET
Abs Immature Granulocytes: 0.07 K/uL (ref 0.00–0.07)
Basophils Absolute: 0.1 K/uL (ref 0.0–0.1)
Basophils Relative: 0 %
Eosinophils Absolute: 0.1 K/uL (ref 0.0–0.5)
Eosinophils Relative: 1 %
HCT: 40.1 % (ref 36.0–46.0)
Hemoglobin: 13 g/dL (ref 12.0–15.0)
Immature Granulocytes: 1 %
Lymphocytes Relative: 10 %
Lymphs Abs: 1.4 K/uL (ref 0.7–4.0)
MCH: 32.7 pg (ref 26.0–34.0)
MCHC: 32.4 g/dL (ref 30.0–36.0)
MCV: 100.8 fL — ABNORMAL HIGH (ref 80.0–100.0)
Monocytes Absolute: 1.1 K/uL — ABNORMAL HIGH (ref 0.1–1.0)
Monocytes Relative: 8 %
Neutro Abs: 10.8 K/uL — ABNORMAL HIGH (ref 1.7–7.7)
Neutrophils Relative %: 80 %
Platelets: 223 K/uL (ref 150–400)
RBC: 3.98 MIL/uL (ref 3.87–5.11)
RDW: 12.4 % (ref 11.5–15.5)
WBC: 13.4 K/uL — ABNORMAL HIGH (ref 4.0–10.5)
nRBC: 0 % (ref 0.0–0.2)

## 2024-01-31 LAB — I-STAT CHEM 8, ED
BUN: 13 mg/dL (ref 8–23)
Calcium, Ion: 1.1 mmol/L — ABNORMAL LOW (ref 1.15–1.40)
Chloride: 100 mmol/L (ref 98–111)
Creatinine, Ser: 0.5 mg/dL (ref 0.44–1.00)
Glucose, Bld: 121 mg/dL — ABNORMAL HIGH (ref 70–99)
HCT: 40 % (ref 36.0–46.0)
Hemoglobin: 13.6 g/dL (ref 12.0–15.0)
Potassium: 4 mmol/L (ref 3.5–5.1)
Sodium: 138 mmol/L (ref 135–145)
TCO2: 27 mmol/L (ref 22–32)

## 2024-01-31 LAB — BASIC METABOLIC PANEL WITH GFR
Anion gap: 8 (ref 5–15)
BUN: 11 mg/dL (ref 8–23)
CO2: 27 mmol/L (ref 22–32)
Calcium: 9.3 mg/dL (ref 8.9–10.3)
Chloride: 102 mmol/L (ref 98–111)
Creatinine, Ser: 0.51 mg/dL (ref 0.44–1.00)
GFR, Estimated: >60 mL/min (ref >60)
Glucose, Bld: 122 mg/dL — ABNORMAL HIGH (ref 70–99)
Potassium: 3.8 mmol/L (ref 3.5–5.1)
Sodium: 137 mmol/L (ref 135–145)

## 2024-01-31 MED ORDER — KETOROLAC TROMETHAMINE 15 MG/ML IJ SOLN
15.0000 mg | Freq: Once | INTRAMUSCULAR | Status: AC
  Administered 2024-01-31: 15 mg via INTRAVENOUS
  Filled 2024-01-31: qty 1

## 2024-01-31 MED ORDER — METAXALONE 800 MG PO TABS: 800.0000 mg | ORAL_TABLET | Freq: Three times a day (TID) | ORAL | 0 refills | Status: AC

## 2024-01-31 MED ORDER — HYDROMORPHONE HCL 1 MG/ML IJ SOLN
0.5000 mg | Freq: Once | INTRAMUSCULAR | Status: AC
  Administered 2024-01-31: 0.5 mg via INTRAVENOUS
  Filled 2024-01-31: qty 1

## 2024-01-31 MED ORDER — LIDOCAINE 5 % EX PTCH: 1.0000 | MEDICATED_PATCH | CUTANEOUS | 0 refills | Status: DC

## 2024-01-31 MED ORDER — LIDOCAINE 5 % EX PTCH: 1.0000 | MEDICATED_PATCH | CUTANEOUS | 0 refills | Status: AC

## 2024-01-31 MED ORDER — IOHEXOL 350 MG/ML SOLN
75.0000 mL | Freq: Once | INTRAVENOUS | Status: AC | PRN
  Administered 2024-01-31: 75 mL via INTRAVENOUS

## 2024-01-31 NOTE — ED Triage Notes (Addendum)
 Patient BIB EMS from home c/o right shoulder pain that radiates to the right ear that is triggered by movement, however, is also reacting in pain when the left shoulder is moved. No recent trauma to the right shoulder. Patient has a history of AAA.

## 2024-01-31 NOTE — Discharge Instructions (Addendum)
 Today you were seen for left shoulder pain.  Your workup was reassuring.  You may take Tylenol , Motrin, or your at home pain medication as needed for pain. You have also been prescribed a muscle relaxer, be careful as this medication may make you unsteady or drowsy.  Please follow-up with your primary care if your symptoms persist for further evaluation and workup.  Thank you for letting us  treat you today. After reviewing your labs and imaging, I feel you are safe to go home. Please follow up with your PCP in the next several days and provide them with your records from this visit. Return to the Emergency Room if pain becomes severe or symptoms worsen.

## 2024-01-31 NOTE — ED Provider Notes (Addendum)
  EMERGENCY DEPARTMENT AT [facility] Provider Note   CSN: [id number] Arrival date & time: 01/31/24  9086     Patient presents with: Shoulder Pain (Radiates to ear)   Kaitlyn Martin is a 83 y.o. female past medical history significant for AAA, COPD, carotid artery stenosis, osteoarthritis, and chronic pain presents today for left shoulder pain that radiates up the neck and behind the left ear.  Patient reports it is worse with movement.  Patient denies any trauma, chest pain, nausea, vomiting, numbness, tingling, confusion, dizziness, any other complaints at this time.    Shoulder Pain      Prior to Admission medications   Medication Sig Start Date End Date Taking? Authorizing Provider  metaxalone  (SKELAXIN ) 800 MG tablet Take 1 tablet (800 mg total) by mouth 3 (three) times daily. 01/31/24  Yes Ylonda Storr N, PA-C  alendronate (FOSAMAX) 70 MG tablet Take 70 mg by mouth every Saturday. 04/18/21   [provider]  celecoxib  (CELEBREX ) 200 MG capsule Take 200 mg by mouth 2 (two) times daily. Patient not taking: Reported on 08/26/2023    [provider]  HYDROcodone -acetaminophen  (NORCO) 10-325 MG tablet Take 1 tablet by mouth every 4 (four) hours as needed. 08/23/22   Joshua Alm Hamilton, MD  hydrOXYzine  (VISTARIL ) 25 MG capsule Take 25-50 mg by mouth at bedtime.    [provider]  lidocaine  (LIDODERM ) 5 % Place 1 patch onto the skin daily. Remove & Discard patch within 12 hours or as directed by MD 01/31/24   Pamela Intrieri N, PA-C  rosuvastatin (CRESTOR) 5 MG tablet Take 5 mg by mouth at bedtime. 06/05/22   [provider]  triamcinolone  cream (KENALOG ) 0.1 % Apply 1 application  topically 2 (two) times daily as needed (eczema).    [provider]    Allergies: Other, Penicillins, Codeine, and Statins    Review of Systems  Musculoskeletal:  Positive for arthralgias and myalgias.    Updated Vital Signs BP 137/64   Pulse 88    Temp 98.2 F (36.8 C) (Oral)   Resp (!) 25   Ht 5' (1.524 m)   Wt 93 kg   SpO2 93%   BMI 40.04 kg/m   Physical Exam Vitals and nursing note reviewed.  Constitutional:      General: She is not in acute distress.    Appearance: Normal appearance. She is well-developed.  HENT:     Head: Normocephalic and atraumatic.     Right Ear: External ear normal.     Left Ear: External ear normal.     Nose: Nose normal.     Mouth/Throat:     Mouth: Mucous membranes are moist.  Eyes:     Extraocular Movements: Extraocular movements intact.     Conjunctiva/sclera: Conjunctivae normal.  Neck:     Comments: Tenderness to palpation of the left SCM.  Patient denies tenderness to palpation of the bony C-spine, collarbone, or left shoulder.  Patient reports pain with range of motion of left arm in the same area no head tenderness to palpation.  Patient is neurovascularly intact with +2 radial pulses. Cardiovascular:     Rate and Rhythm: Normal rate and regular rhythm.     Pulses: Normal pulses.     Heart sounds: Normal heart sounds. No murmur heard. Pulmonary:     Effort: Pulmonary effort is normal. No respiratory distress.     Breath sounds: Normal breath sounds.  Abdominal:     Palpations: Abdomen  is soft.     Tenderness: There is no abdominal tenderness.  Musculoskeletal:        General: No swelling.     Cervical back: Neck supple. Tenderness present. No rigidity.  Skin:    General: Skin is warm and dry.     Capillary Refill: Capillary refill takes less than 2 seconds.  Neurological:     General: No focal deficit present.     Mental Status: She is alert and oriented to person, place, and time.     Sensory: No sensory deficit.  Psychiatric:        Mood and Affect: Mood normal.     (all labs ordered are listed, but only abnormal results are displayed) Labs Reviewed  CBC WITH DIFFERENTIAL/PLATELET - Abnormal; Notable for the following components:      Result Value   WBC 13.4 (*)     MCV 100.8 (*)    Neutro Abs 10.8 (*)    Monocytes Absolute 1.1 (*)    All other components within normal limits  BASIC METABOLIC PANEL WITH GFR - Abnormal; Notable for the following components:   Glucose, Bld 122 (*)    All other components within normal limits  URINALYSIS, ROUTINE W REFLEX MICROSCOPIC - Abnormal; Notable for the following components:   Color, Urine STRAW (*)    All other components within normal limits  I-STAT CHEM 8, ED - Abnormal; Notable for the following components:   Glucose, Bld 121 (*)    Calcium, Ion 1.10 (*)    All other components within normal limits  TROPONIN I (HIGH SENSITIVITY)  TROPONIN I (HIGH SENSITIVITY)    EKG: EKG Interpretation Date/Time:  Friday January 31 2024 09:20:46 EDT Ventricular Rate:  89 PR Interval:  175 QRS Duration:  111 QT Interval:  384 QTC Calculation: 468 R Axis:   43  Text Interpretation: Sinus rhythm Anterior infarct, old Nonspecific T abnormalities, lateral leads Confirmed by Yolande Charleston [phone]) on 01/31/2024 11:44:17 AM  Radiology: CT Angio Neck W and/or Wo Contrast Result Date: 01/31/2024 EXAM: CTA Neck 01/31/2024 12:57:18 PM TECHNIQUE: CT of the neck was performed with the administration of intravenous contrast. Multiplanar 2D and/or 3D reformatted images are provided for review. Automated exposure control, iterative reconstruction, and/or weight based adjustment of the mA/kV was utilized to reduce the radiation dose to as low as reasonably achievable. Stenosis of the internal carotid arteries measured using NASCET criteria. COMPARISON: 08/10/2020 CLINICAL HISTORY: Carotid artery stenosis. Patient BIB EMS from home c/o right shoulder pain that radiates to the right ear that is triggered by movement, however, is also reacting in pain when the left shoulder is moved. No recent trauma to the right shoulder. Patient has a history of AAA. FINDINGS: AORTIC ARCH AND ARCH VESSELS: Atherosclerotic calcifications of the aortic  arch and arch vessel origins, which remain patent. No dissection or arterial injury. No significant stenosis of the brachiocephalic or subclavian arteries. CERVICAL CAROTID ARTERIES: No dissection, arterial injury, or hemodynamically significant stenosis by NASCET criteria. CERVICAL VERTEBRAL ARTERIES: Streak artifact from venous reflux of contrast bolus attenuates the proximal V2 segment of the nondominant right vertebral artery. The vertebral arteries are otherwise patent without significant stenosis or evidence of dissection. LUNGS AND MEDIASTINUM: Unremarkable. SOFT TISSUES: No acute abnormality. BONES: No acute abnormality. IMPRESSION: 1. No significant stenosis or dissection of the neck vessels. Electronically signed by: Ryan Chess MD 01/31/2024 01:18 PM EDT RP Workstation: [id number]   DG Chest Portable 1 View Result Date: 01/31/2024 CLINICAL DATA:  Left shoulder and left neck pain. EXAM: PORTABLE CHEST 1 VIEW COMPARISON:  05/16/2017. FINDINGS: The heart size is upper limits of normal. Mediastinal contours are within normal limits. No focal consolidation, pleural effusion, or pneumothorax. No acute osseous abnormality. IMPRESSION: No acute cardiopulmonary findings. Electronically Signed   By: Harrietta Sherry M.D.   On: 01/31/2024 10:49     Procedures   Medications Ordered in the ED  ketorolac  (TORADOL ) 15 MG/ML injection 15 mg (15 mg Intravenous Given 01/31/24 1137)  HYDROmorphone  (DILAUDID ) injection 0.5 mg (0.5 mg Intravenous Given 01/31/24 1137)  iohexol  (OMNIPAQUE ) 350 MG/ML injection 75 mL (75 mLs Intravenous Contrast Given 01/31/24 1238)                                    Medical Decision Making Amount and/or Complexity of Data Reviewed Labs: ordered. Radiology: ordered.  Risk Prescription drug management.   This patient presents to the ED for concern of left shoulder/neck pain, this involves an extensive number of treatment options, and is a complaint that carries with it a  high risk of complications and morbidity.  The differential diagnosis includes STEMI, NSTEMI, musculoskeletal pain, carotid artery stenosis   Additional history obtained:  Additional history obtained from EMR External records from outside source obtained and reviewed including Care Everywhere   Lab Tests:  I Ordered, and personally interpreted labs.  The pertinent results include: UA WNL, leukocytosis of 13.4, troponin 7, 8, BMP with no notable findings   Imaging Studies ordered:  I ordered imaging studies including CXR  I independently visualized and interpreted imaging which showed no acute cardiopulmonary findings I agree with the radiologist interpretation CT Angio Neck which showed no significant stenosis or dissection of the neck vessels   Cardiac Monitoring: / EKG:  The patient was maintained on a cardiac monitor.  I personally viewed and interpreted the cardiac monitored which showed an underlying rhythm of: Sinus rhythm, nonspecific T abnormalities   Problem List / ED Course / Critical interventions / Medication management I ordered medication including Toradol  and Dilaudid  I have reviewed the patients home medicines and have made adjustments as needed  Test / Admission - Considered:  Considered for admission or further workup however patient's vital signs, physical exam, labs, and imaging are reassuring.  Patient symptoms likely due to musculoskeletal pain.  Patient advised to take Tylenol , Motrin, and at home pain meds.  Patient prescribed Lidoderm  patches and short course of muscle relaxers.  Patient to follow-up with primary care if her symptoms persist for further evaluation and workup.     Final diagnoses:  Musculoskeletal pain    ED Discharge Orders          Ordered    lidocaine  (LIDODERM ) 5 %  Every 24 hours,   Status:  Discontinued        01/31/24 1346    lidocaine  (LIDODERM ) 5 %  Every 24 hours        01/31/24 1353    metaxalone  (SKELAXIN ) 800 MG  tablet  3 times daily        01/31/24 1353               Francis Ileana SAILOR, PA-C 01/31/24 1347    Francis Ileana SAILOR, PA-C 01/31/24 1405    Yolande Lamar BROCKS, MD 02/05/24 [phone]

## 2024-02-17 DIAGNOSIS — M542 Cervicalgia: Secondary | ICD-10-CM | POA: Diagnosis not present

## 2024-02-17 DIAGNOSIS — M549 Dorsalgia, unspecified: Secondary | ICD-10-CM | POA: Diagnosis not present

## 2024-02-17 DIAGNOSIS — E785 Hyperlipidemia, unspecified: Secondary | ICD-10-CM | POA: Diagnosis not present

## 2024-02-17 DIAGNOSIS — M8580 Other specified disorders of bone density and structure, unspecified site: Secondary | ICD-10-CM | POA: Diagnosis not present

## 2024-02-17 DIAGNOSIS — Z79899 Other long term (current) drug therapy: Secondary | ICD-10-CM | POA: Diagnosis not present

## 2024-02-17 DIAGNOSIS — M06049 Rheumatoid arthritis without rheumatoid factor, unspecified hand: Secondary | ICD-10-CM | POA: Diagnosis not present

## 2024-02-17 DIAGNOSIS — I10 Essential (primary) hypertension: Secondary | ICD-10-CM | POA: Diagnosis not present

## 2024-02-17 DIAGNOSIS — Z23 Encounter for immunization: Secondary | ICD-10-CM | POA: Diagnosis not present

## 2024-03-05 DIAGNOSIS — M47816 Spondylosis without myelopathy or radiculopathy, lumbar region: Secondary | ICD-10-CM | POA: Diagnosis not present

## 2024-03-19 DIAGNOSIS — M2550 Pain in unspecified joint: Secondary | ICD-10-CM | POA: Diagnosis not present

## 2024-03-19 DIAGNOSIS — M150 Primary generalized (osteo)arthritis: Secondary | ICD-10-CM | POA: Diagnosis not present

## 2024-03-19 DIAGNOSIS — M47816 Spondylosis without myelopathy or radiculopathy, lumbar region: Secondary | ICD-10-CM | POA: Diagnosis not present

## 2024-04-09 DIAGNOSIS — M47816 Spondylosis without myelopathy or radiculopathy, lumbar region: Secondary | ICD-10-CM | POA: Diagnosis not present
